# Patient Record
Sex: Female | Born: 1960
Health system: Southern US, Community
[De-identification: ages and names within clinical notes are randomized; demographics above are authoritative.]

## PROBLEM LIST (undated history)

## (undated) DIAGNOSIS — N200 Calculus of kidney: Secondary | ICD-10-CM

## (undated) DIAGNOSIS — N189 Chronic kidney disease, unspecified: Secondary | ICD-10-CM

## (undated) DIAGNOSIS — J45909 Unspecified asthma, uncomplicated: Secondary | ICD-10-CM

## (undated) DIAGNOSIS — L309 Dermatitis, unspecified: Secondary | ICD-10-CM

## (undated) DIAGNOSIS — F32A Depression, unspecified: Secondary | ICD-10-CM

## (undated) DIAGNOSIS — K649 Unspecified hemorrhoids: Secondary | ICD-10-CM

## (undated) DIAGNOSIS — E559 Vitamin D deficiency, unspecified: Secondary | ICD-10-CM

## (undated) DIAGNOSIS — L719 Rosacea, unspecified: Secondary | ICD-10-CM

## (undated) DIAGNOSIS — E039 Hypothyroidism, unspecified: Secondary | ICD-10-CM

## (undated) DIAGNOSIS — Z87442 Personal history of urinary calculi: Secondary | ICD-10-CM

## (undated) DIAGNOSIS — I219 Acute myocardial infarction, unspecified: Secondary | ICD-10-CM

## (undated) DIAGNOSIS — E11319 Type 2 diabetes mellitus with unspecified diabetic retinopathy without macular edema: Secondary | ICD-10-CM

## (undated) DIAGNOSIS — F40240 Claustrophobia: Secondary | ICD-10-CM

## (undated) DIAGNOSIS — Z794 Long term (current) use of insulin: Secondary | ICD-10-CM

## (undated) DIAGNOSIS — R809 Proteinuria, unspecified: Secondary | ICD-10-CM

## (undated) DIAGNOSIS — I10 Essential (primary) hypertension: Secondary | ICD-10-CM

## (undated) DIAGNOSIS — N939 Abnormal uterine and vaginal bleeding, unspecified: Secondary | ICD-10-CM

## (undated) DIAGNOSIS — R519 Headache, unspecified: Secondary | ICD-10-CM

## (undated) DIAGNOSIS — N3946 Mixed incontinence: Secondary | ICD-10-CM

## (undated) DIAGNOSIS — E119 Type 2 diabetes mellitus without complications: Secondary | ICD-10-CM

## (undated) DIAGNOSIS — E785 Hyperlipidemia, unspecified: Secondary | ICD-10-CM

## (undated) DIAGNOSIS — F329 Major depressive disorder, single episode, unspecified: Secondary | ICD-10-CM

## (undated) DIAGNOSIS — Z6841 Body Mass Index (BMI) 40.0 and over, adult: Secondary | ICD-10-CM

## (undated) DIAGNOSIS — M1990 Unspecified osteoarthritis, unspecified site: Secondary | ICD-10-CM

## (undated) DIAGNOSIS — D126 Benign neoplasm of colon, unspecified: Secondary | ICD-10-CM

## (undated) HISTORY — PX: COLONOSCOPY: SHX174

## (undated) HISTORY — PX: JOINT REPLACEMENT: SHX530

## (undated) HISTORY — PX: OTHER SURGICAL HISTORY: SHX169

## (undated) HISTORY — PX: LITHOTRIPSY: SUR834

## (undated) HISTORY — DX: Calculus of kidney: N20.0

---
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
## 2004-04-26 ENCOUNTER — Ambulatory Visit: Payer: Self-pay | Admitting: Family Medicine

## 2004-05-29 ENCOUNTER — Ambulatory Visit: Payer: Self-pay | Admitting: Family Medicine

## 2004-12-21 ENCOUNTER — Ambulatory Visit: Payer: Self-pay | Admitting: Internal Medicine

## 2005-05-13 ENCOUNTER — Ambulatory Visit: Payer: Self-pay

## 2006-06-02 ENCOUNTER — Ambulatory Visit: Payer: Self-pay

## 2007-06-04 ENCOUNTER — Ambulatory Visit: Payer: Self-pay

## 2007-12-20 ENCOUNTER — Emergency Department: Payer: Self-pay | Admitting: Emergency Medicine

## 2008-05-04 ENCOUNTER — Ambulatory Visit: Payer: Self-pay | Admitting: Urology

## 2008-05-04 ENCOUNTER — Emergency Department: Payer: Self-pay | Admitting: Emergency Medicine

## 2008-05-05 ENCOUNTER — Ambulatory Visit: Payer: Self-pay | Admitting: Urology

## 2008-05-20 ENCOUNTER — Ambulatory Visit: Payer: Self-pay | Admitting: Urology

## 2008-07-04 ENCOUNTER — Ambulatory Visit: Payer: Self-pay

## 2009-07-10 ENCOUNTER — Ambulatory Visit: Payer: Self-pay

## 2010-05-15 ENCOUNTER — Ambulatory Visit: Payer: Self-pay | Admitting: Urology

## 2010-06-05 ENCOUNTER — Ambulatory Visit: Payer: Self-pay | Admitting: Urology

## 2010-06-11 ENCOUNTER — Ambulatory Visit: Payer: Self-pay | Admitting: Urology

## 2010-10-29 ENCOUNTER — Ambulatory Visit: Payer: Self-pay | Admitting: Orthopedic Surgery

## 2011-02-19 ENCOUNTER — Ambulatory Visit: Payer: Self-pay | Admitting: Obstetrics and Gynecology

## 2011-07-15 ENCOUNTER — Ambulatory Visit: Payer: Self-pay | Admitting: Internal Medicine

## 2012-02-25 ENCOUNTER — Ambulatory Visit: Payer: Self-pay | Admitting: Obstetrics and Gynecology

## 2012-07-03 ENCOUNTER — Ambulatory Visit: Payer: Self-pay | Admitting: Unknown Physician Specialty

## 2012-07-06 LAB — PATHOLOGY REPORT

## 2013-02-25 ENCOUNTER — Ambulatory Visit: Payer: Self-pay | Admitting: Obstetrics and Gynecology

## 2014-01-24 DIAGNOSIS — E559 Vitamin D deficiency, unspecified: Secondary | ICD-10-CM | POA: Insufficient documentation

## 2014-03-09 ENCOUNTER — Ambulatory Visit: Payer: Self-pay | Admitting: Obstetrics and Gynecology

## 2014-03-14 ENCOUNTER — Ambulatory Visit: Payer: Self-pay | Admitting: Obstetrics and Gynecology

## 2014-09-13 ENCOUNTER — Ambulatory Visit: Payer: Self-pay | Admitting: Obstetrics and Gynecology

## 2015-03-06 ENCOUNTER — Other Ambulatory Visit: Payer: Self-pay | Admitting: Obstetrics and Gynecology

## 2015-03-06 DIAGNOSIS — Z1231 Encounter for screening mammogram for malignant neoplasm of breast: Secondary | ICD-10-CM

## 2015-03-06 DIAGNOSIS — R921 Mammographic calcification found on diagnostic imaging of breast: Secondary | ICD-10-CM

## 2015-03-16 ENCOUNTER — Ambulatory Visit
Admission: RE | Admit: 2015-03-16 | Discharge: 2015-03-16 | Disposition: A | Discharge status: Home / Self Care | Payer: 59 | Source: Ambulatory Visit | Attending: Obstetrics and Gynecology | Admitting: Obstetrics and Gynecology

## 2015-03-16 ENCOUNTER — Other Ambulatory Visit: Payer: Self-pay | Admitting: Obstetrics and Gynecology

## 2015-03-16 DIAGNOSIS — R921 Mammographic calcification found on diagnostic imaging of breast: Secondary | ICD-10-CM | POA: Diagnosis not present

## 2015-03-16 DIAGNOSIS — Z1231 Encounter for screening mammogram for malignant neoplasm of breast: Secondary | ICD-10-CM

## 2015-06-27 ENCOUNTER — Other Ambulatory Visit: Payer: Self-pay | Admitting: Urology

## 2015-06-27 DIAGNOSIS — R319 Hematuria, unspecified: Secondary | ICD-10-CM

## 2015-06-27 DIAGNOSIS — N200 Calculus of kidney: Secondary | ICD-10-CM

## 2015-07-05 ENCOUNTER — Ambulatory Visit: Payer: 59

## 2016-03-18 DIAGNOSIS — M1712 Unilateral primary osteoarthritis, left knee: Secondary | ICD-10-CM | POA: Insufficient documentation

## 2016-03-27 ENCOUNTER — Other Ambulatory Visit: Payer: Self-pay | Admitting: Obstetrics and Gynecology

## 2016-03-27 DIAGNOSIS — R921 Mammographic calcification found on diagnostic imaging of breast: Secondary | ICD-10-CM

## 2016-03-27 DIAGNOSIS — Z1231 Encounter for screening mammogram for malignant neoplasm of breast: Secondary | ICD-10-CM

## 2016-04-12 ENCOUNTER — Other Ambulatory Visit: Payer: Self-pay | Admitting: Obstetrics and Gynecology

## 2016-04-12 ENCOUNTER — Ambulatory Visit
Admission: RE | Admit: 2016-04-12 | Discharge: 2016-04-12 | Disposition: A | Discharge status: Home / Self Care | Payer: BLUE CROSS/BLUE SHIELD | Source: Ambulatory Visit | Attending: Obstetrics and Gynecology | Admitting: Obstetrics and Gynecology

## 2016-04-12 DIAGNOSIS — R921 Mammographic calcification found on diagnostic imaging of breast: Secondary | ICD-10-CM | POA: Diagnosis present

## 2016-04-12 DIAGNOSIS — Z1231 Encounter for screening mammogram for malignant neoplasm of breast: Secondary | ICD-10-CM

## 2016-09-12 DIAGNOSIS — M1712 Unilateral primary osteoarthritis, left knee: Secondary | ICD-10-CM | POA: Diagnosis not present

## 2016-09-12 DIAGNOSIS — Z6841 Body Mass Index (BMI) 40.0 and over, adult: Secondary | ICD-10-CM | POA: Diagnosis not present

## 2016-11-28 DIAGNOSIS — E785 Hyperlipidemia, unspecified: Secondary | ICD-10-CM | POA: Diagnosis not present

## 2016-11-28 DIAGNOSIS — E1165 Type 2 diabetes mellitus with hyperglycemia: Secondary | ICD-10-CM | POA: Diagnosis not present

## 2016-11-28 DIAGNOSIS — E559 Vitamin D deficiency, unspecified: Secondary | ICD-10-CM | POA: Diagnosis not present

## 2016-11-28 DIAGNOSIS — E538 Deficiency of other specified B group vitamins: Secondary | ICD-10-CM | POA: Diagnosis not present

## 2016-11-28 DIAGNOSIS — Z794 Long term (current) use of insulin: Secondary | ICD-10-CM | POA: Diagnosis not present

## 2016-12-05 DIAGNOSIS — E1165 Type 2 diabetes mellitus with hyperglycemia: Secondary | ICD-10-CM | POA: Diagnosis not present

## 2016-12-05 DIAGNOSIS — E039 Hypothyroidism, unspecified: Secondary | ICD-10-CM | POA: Diagnosis not present

## 2016-12-05 DIAGNOSIS — E559 Vitamin D deficiency, unspecified: Secondary | ICD-10-CM | POA: Diagnosis not present

## 2016-12-05 DIAGNOSIS — E1129 Type 2 diabetes mellitus with other diabetic kidney complication: Secondary | ICD-10-CM | POA: Diagnosis not present

## 2016-12-10 DIAGNOSIS — M1712 Unilateral primary osteoarthritis, left knee: Secondary | ICD-10-CM | POA: Diagnosis not present

## 2016-12-17 DIAGNOSIS — L718 Other rosacea: Secondary | ICD-10-CM | POA: Diagnosis not present

## 2016-12-17 DIAGNOSIS — Z86018 Personal history of other benign neoplasm: Secondary | ICD-10-CM | POA: Diagnosis not present

## 2016-12-17 DIAGNOSIS — I8311 Varicose veins of right lower extremity with inflammation: Secondary | ICD-10-CM | POA: Diagnosis not present

## 2016-12-17 DIAGNOSIS — L218 Other seborrheic dermatitis: Secondary | ICD-10-CM | POA: Diagnosis not present

## 2017-01-30 DIAGNOSIS — Z794 Long term (current) use of insulin: Secondary | ICD-10-CM | POA: Diagnosis not present

## 2017-01-30 DIAGNOSIS — E1129 Type 2 diabetes mellitus with other diabetic kidney complication: Secondary | ICD-10-CM | POA: Diagnosis not present

## 2017-01-30 DIAGNOSIS — E1165 Type 2 diabetes mellitus with hyperglycemia: Secondary | ICD-10-CM | POA: Diagnosis not present

## 2017-02-06 DIAGNOSIS — Z794 Long term (current) use of insulin: Secondary | ICD-10-CM | POA: Diagnosis not present

## 2017-02-06 DIAGNOSIS — Z6841 Body Mass Index (BMI) 40.0 and over, adult: Secondary | ICD-10-CM | POA: Diagnosis not present

## 2017-02-06 DIAGNOSIS — E1165 Type 2 diabetes mellitus with hyperglycemia: Secondary | ICD-10-CM | POA: Diagnosis not present

## 2017-02-20 DIAGNOSIS — E1129 Type 2 diabetes mellitus with other diabetic kidney complication: Secondary | ICD-10-CM | POA: Diagnosis not present

## 2017-02-20 DIAGNOSIS — R809 Proteinuria, unspecified: Secondary | ICD-10-CM | POA: Diagnosis not present

## 2017-02-20 DIAGNOSIS — Z6841 Body Mass Index (BMI) 40.0 and over, adult: Secondary | ICD-10-CM | POA: Diagnosis not present

## 2017-03-11 DIAGNOSIS — E1165 Type 2 diabetes mellitus with hyperglycemia: Secondary | ICD-10-CM | POA: Diagnosis not present

## 2017-03-11 DIAGNOSIS — Z794 Long term (current) use of insulin: Secondary | ICD-10-CM | POA: Diagnosis not present

## 2017-03-11 DIAGNOSIS — E1129 Type 2 diabetes mellitus with other diabetic kidney complication: Secondary | ICD-10-CM | POA: Diagnosis not present

## 2017-03-11 DIAGNOSIS — E039 Hypothyroidism, unspecified: Secondary | ICD-10-CM | POA: Diagnosis not present

## 2017-03-13 DIAGNOSIS — Z6841 Body Mass Index (BMI) 40.0 and over, adult: Secondary | ICD-10-CM | POA: Diagnosis not present

## 2017-03-13 DIAGNOSIS — M1712 Unilateral primary osteoarthritis, left knee: Secondary | ICD-10-CM | POA: Diagnosis not present

## 2017-03-19 DIAGNOSIS — L218 Other seborrheic dermatitis: Secondary | ICD-10-CM | POA: Diagnosis not present

## 2017-03-19 DIAGNOSIS — L718 Other rosacea: Secondary | ICD-10-CM | POA: Diagnosis not present

## 2017-03-27 ENCOUNTER — Other Ambulatory Visit: Payer: Self-pay | Admitting: Obstetrics and Gynecology

## 2017-03-27 DIAGNOSIS — Z1231 Encounter for screening mammogram for malignant neoplasm of breast: Secondary | ICD-10-CM

## 2017-03-27 DIAGNOSIS — Z01419 Encounter for gynecological examination (general) (routine) without abnormal findings: Secondary | ICD-10-CM | POA: Diagnosis not present

## 2017-03-27 DIAGNOSIS — Z1211 Encounter for screening for malignant neoplasm of colon: Secondary | ICD-10-CM | POA: Diagnosis not present

## 2017-04-15 ENCOUNTER — Ambulatory Visit
Admission: RE | Admit: 2017-04-15 | Discharge: 2017-04-15 | Disposition: A | Discharge status: Home / Self Care | Payer: BLUE CROSS/BLUE SHIELD | Source: Ambulatory Visit | Attending: Obstetrics and Gynecology | Admitting: Obstetrics and Gynecology

## 2017-04-15 DIAGNOSIS — Z1231 Encounter for screening mammogram for malignant neoplasm of breast: Secondary | ICD-10-CM | POA: Diagnosis not present

## 2017-05-20 DIAGNOSIS — E119 Type 2 diabetes mellitus without complications: Secondary | ICD-10-CM | POA: Diagnosis not present

## 2017-06-12 DIAGNOSIS — R809 Proteinuria, unspecified: Secondary | ICD-10-CM | POA: Diagnosis not present

## 2017-06-12 DIAGNOSIS — E559 Vitamin D deficiency, unspecified: Secondary | ICD-10-CM | POA: Diagnosis not present

## 2017-06-12 DIAGNOSIS — E1129 Type 2 diabetes mellitus with other diabetic kidney complication: Secondary | ICD-10-CM | POA: Diagnosis not present

## 2017-06-12 DIAGNOSIS — Z794 Long term (current) use of insulin: Secondary | ICD-10-CM | POA: Diagnosis not present

## 2017-06-12 DIAGNOSIS — E039 Hypothyroidism, unspecified: Secondary | ICD-10-CM | POA: Diagnosis not present

## 2017-07-10 DIAGNOSIS — E559 Vitamin D deficiency, unspecified: Secondary | ICD-10-CM | POA: Diagnosis not present

## 2017-07-10 DIAGNOSIS — E039 Hypothyroidism, unspecified: Secondary | ICD-10-CM | POA: Diagnosis not present

## 2017-07-10 DIAGNOSIS — E1129 Type 2 diabetes mellitus with other diabetic kidney complication: Secondary | ICD-10-CM | POA: Diagnosis not present

## 2017-07-18 DIAGNOSIS — R531 Weakness: Secondary | ICD-10-CM | POA: Diagnosis not present

## 2017-07-18 DIAGNOSIS — E039 Hypothyroidism, unspecified: Secondary | ICD-10-CM | POA: Diagnosis not present

## 2017-07-18 DIAGNOSIS — E1129 Type 2 diabetes mellitus with other diabetic kidney complication: Secondary | ICD-10-CM | POA: Diagnosis not present

## 2017-07-18 DIAGNOSIS — Z794 Long term (current) use of insulin: Secondary | ICD-10-CM | POA: Diagnosis not present

## 2017-07-18 DIAGNOSIS — E559 Vitamin D deficiency, unspecified: Secondary | ICD-10-CM | POA: Diagnosis not present

## 2017-07-18 DIAGNOSIS — R809 Proteinuria, unspecified: Secondary | ICD-10-CM | POA: Diagnosis not present

## 2017-07-31 DIAGNOSIS — E1129 Type 2 diabetes mellitus with other diabetic kidney complication: Secondary | ICD-10-CM | POA: Diagnosis not present

## 2017-07-31 DIAGNOSIS — E1165 Type 2 diabetes mellitus with hyperglycemia: Secondary | ICD-10-CM | POA: Diagnosis not present

## 2017-07-31 DIAGNOSIS — R809 Proteinuria, unspecified: Secondary | ICD-10-CM | POA: Diagnosis not present

## 2017-08-26 DIAGNOSIS — M1712 Unilateral primary osteoarthritis, left knee: Secondary | ICD-10-CM | POA: Diagnosis not present

## 2017-09-12 DIAGNOSIS — Z794 Long term (current) use of insulin: Secondary | ICD-10-CM | POA: Diagnosis not present

## 2017-09-12 DIAGNOSIS — E119 Type 2 diabetes mellitus without complications: Secondary | ICD-10-CM | POA: Diagnosis not present

## 2017-09-12 DIAGNOSIS — Z86010 Personal history of colonic polyps: Secondary | ICD-10-CM | POA: Diagnosis not present

## 2017-10-03 DIAGNOSIS — E1129 Type 2 diabetes mellitus with other diabetic kidney complication: Secondary | ICD-10-CM | POA: Diagnosis not present

## 2017-10-03 DIAGNOSIS — E039 Hypothyroidism, unspecified: Secondary | ICD-10-CM | POA: Diagnosis not present

## 2017-10-03 DIAGNOSIS — E785 Hyperlipidemia, unspecified: Secondary | ICD-10-CM | POA: Diagnosis not present

## 2017-10-03 DIAGNOSIS — E538 Deficiency of other specified B group vitamins: Secondary | ICD-10-CM | POA: Diagnosis not present

## 2017-11-10 DIAGNOSIS — E119 Type 2 diabetes mellitus without complications: Secondary | ICD-10-CM | POA: Diagnosis not present

## 2017-11-10 DIAGNOSIS — E559 Vitamin D deficiency, unspecified: Secondary | ICD-10-CM | POA: Diagnosis not present

## 2017-11-13 DIAGNOSIS — E119 Type 2 diabetes mellitus without complications: Secondary | ICD-10-CM | POA: Diagnosis not present

## 2017-11-25 DIAGNOSIS — M1712 Unilateral primary osteoarthritis, left knee: Secondary | ICD-10-CM | POA: Diagnosis not present

## 2017-11-28 ENCOUNTER — Encounter: Payer: Self-pay | Admitting: Student

## 2017-12-01 ENCOUNTER — Ambulatory Visit
Admission: RE | Admit: 2017-12-01 | Discharge: 2017-12-01 | Disposition: A | Discharge status: Home / Self Care | Payer: BLUE CROSS/BLUE SHIELD | Source: Ambulatory Visit | Attending: Unknown Physician Specialty | Admitting: Unknown Physician Specialty

## 2017-12-01 ENCOUNTER — Encounter
Admission: RE | Disposition: A | Discharge status: Home / Self Care | Payer: Self-pay | Source: Ambulatory Visit | Attending: Unknown Physician Specialty

## 2017-12-01 ENCOUNTER — Ambulatory Visit: Payer: BLUE CROSS/BLUE SHIELD | Admitting: Anesthesiology

## 2017-12-01 DIAGNOSIS — E119 Type 2 diabetes mellitus without complications: Secondary | ICD-10-CM | POA: Insufficient documentation

## 2017-12-01 DIAGNOSIS — Z86010 Personal history of colonic polyps: Secondary | ICD-10-CM | POA: Diagnosis not present

## 2017-12-01 DIAGNOSIS — K640 First degree hemorrhoids: Secondary | ICD-10-CM | POA: Insufficient documentation

## 2017-12-01 DIAGNOSIS — J45909 Unspecified asthma, uncomplicated: Secondary | ICD-10-CM | POA: Diagnosis not present

## 2017-12-01 DIAGNOSIS — Z6841 Body Mass Index (BMI) 40.0 and over, adult: Secondary | ICD-10-CM | POA: Diagnosis not present

## 2017-12-01 DIAGNOSIS — Z888 Allergy status to other drugs, medicaments and biological substances status: Secondary | ICD-10-CM | POA: Diagnosis not present

## 2017-12-01 DIAGNOSIS — E785 Hyperlipidemia, unspecified: Secondary | ICD-10-CM | POA: Diagnosis not present

## 2017-12-01 DIAGNOSIS — F329 Major depressive disorder, single episode, unspecified: Secondary | ICD-10-CM | POA: Diagnosis not present

## 2017-12-01 DIAGNOSIS — Z1211 Encounter for screening for malignant neoplasm of colon: Secondary | ICD-10-CM | POA: Diagnosis not present

## 2017-12-01 DIAGNOSIS — Z885 Allergy status to narcotic agent status: Secondary | ICD-10-CM | POA: Insufficient documentation

## 2017-12-01 DIAGNOSIS — Z794 Long term (current) use of insulin: Secondary | ICD-10-CM | POA: Insufficient documentation

## 2017-12-01 DIAGNOSIS — Z7982 Long term (current) use of aspirin: Secondary | ICD-10-CM | POA: Insufficient documentation

## 2017-12-01 DIAGNOSIS — Z79899 Other long term (current) drug therapy: Secondary | ICD-10-CM | POA: Insufficient documentation

## 2017-12-01 DIAGNOSIS — K648 Other hemorrhoids: Secondary | ICD-10-CM | POA: Diagnosis not present

## 2017-12-01 HISTORY — DX: Depression, unspecified: F32.A

## 2017-12-01 HISTORY — DX: Type 2 diabetes mellitus without complications: E11.9

## 2017-12-01 HISTORY — DX: Dermatitis, unspecified: L30.9

## 2017-12-01 HISTORY — DX: Hyperlipidemia, unspecified: E78.5

## 2017-12-01 HISTORY — DX: Unspecified asthma, uncomplicated: J45.909

## 2017-12-01 HISTORY — PX: COLONOSCOPY WITH PROPOFOL: SHX5780

## 2017-12-01 HISTORY — DX: Body Mass Index (BMI) 40.0 and over, adult: Z684

## 2017-12-01 HISTORY — DX: Abnormal uterine and vaginal bleeding, unspecified: N93.9

## 2017-12-01 HISTORY — DX: Morbid (severe) obesity due to excess calories: E66.01

## 2017-12-01 HISTORY — DX: Personal history of urinary calculi: Z87.442

## 2017-12-01 HISTORY — DX: Calculus of kidney: N20.0

## 2017-12-01 HISTORY — DX: Mixed incontinence: N39.46

## 2017-12-01 HISTORY — DX: Major depressive disorder, single episode, unspecified: F32.9

## 2017-12-01 LAB — GLUCOSE, CAPILLARY: GLUCOSE-CAPILLARY: 207 mg/dL — AB (ref 65–99)

## 2017-12-01 SURGERY — COLONOSCOPY WITH PROPOFOL
Anesthesia: General

## 2017-12-01 MED ORDER — LIDOCAINE HCL (PF) 2 % IJ SOLN
INTRAMUSCULAR | Status: AC
  Filled 2017-12-01: qty 10

## 2017-12-01 MED ORDER — LIDOCAINE HCL (CARDIAC) PF 100 MG/5ML IV SOSY
PREFILLED_SYRINGE | INTRAVENOUS | Status: DC | PRN
  Administered 2017-12-01: 50 mg via INTRAVENOUS

## 2017-12-01 MED ORDER — PROPOFOL 500 MG/50ML IV EMUL
INTRAVENOUS | Status: DC | PRN
  Administered 2017-12-01: 175 ug/kg/min via INTRAVENOUS

## 2017-12-01 MED ORDER — SODIUM CHLORIDE 0.9 % IV SOLN
INTRAVENOUS | Status: DC
  Administered 2017-12-01: 07:00:00 via INTRAVENOUS

## 2017-12-01 MED ORDER — SODIUM CHLORIDE 0.9 % IV SOLN
INTRAVENOUS | Status: DC
  Administered 2017-12-01: 08:00:00 via INTRAVENOUS

## 2017-12-01 MED ORDER — PROPOFOL 10 MG/ML IV BOLUS
INTRAVENOUS | Status: DC | PRN
  Administered 2017-12-01: 30 mg via INTRAVENOUS

## 2017-12-01 NOTE — Anesthesia Postprocedure Evaluation (Signed)
Anesthesia Post Note  Patient: Stephanie Mathews  Procedure(s) Performed: COLONOSCOPY WITH PROPOFOL (N/A )  Patient location during evaluation: Endoscopy Anesthesia Type: General Level of consciousness: awake and alert and oriented Pain management: pain level controlled Vital Signs Assessment: post-procedure vital signs reviewed and stable Respiratory status: spontaneous breathing, nonlabored ventilation and respiratory function stable Cardiovascular status: blood pressure returned to baseline and stable Postop Assessment: no signs of nausea or vomiting Anesthetic complications: no     Last Vitals:  Vitals:   12/01/17 0826 12/01/17 0836  BP: 120/87 125/80  Pulse: 95 95  Resp: 17 16  Temp:    SpO2: 99% 100%    Last Pain:  Vitals:   12/01/17 0836  TempSrc:   PainSc: 0-No pain                 [name]

## 2017-12-01 NOTE — Anesthesia Procedure Notes (Signed)
Date/Time: 12/01/2017 7:36 AM Performed by: Johnna Acosta, CRNA Pre-anesthesia Checklist: Patient identified, Emergency Drugs available, Suction available, Patient being monitored and Timeout performed Patient Re-evaluated:Patient Re-evaluated prior to induction Oxygen Delivery Method: Nasal cannula Preoxygenation: Pre-oxygenation with 100% oxygen

## 2017-12-01 NOTE — Transfer of Care (Signed)
Immediate Anesthesia Transfer of Care Note  Patient: Stephanie Mathews  Procedure(s) Performed: COLONOSCOPY WITH PROPOFOL (N/A )  Patient Location: PACU  Anesthesia Type:General  Level of Consciousness: sedated  Airway & Oxygen Therapy: Patient Spontanous Breathing and Patient connected to nasal cannula oxygen  Post-op Assessment: Report given to RN and Post -op Vital signs reviewed and stable  Post vital signs: Reviewed and stable  Last Vitals:  Vitals Value Taken Time  BP 95/45 12/01/2017  8:06 AM  Temp 36.1 C 12/01/2017  8:06 AM  Pulse 100 12/01/2017  8:07 AM  Resp 13 12/01/2017  8:07 AM  SpO2 97 % 12/01/2017  8:07 AM  Vitals shown include unvalidated device data.  Last Pain:  Vitals:   12/01/17 0806  TempSrc: Tympanic  PainSc: 0-No pain         Complications: No apparent anesthesia complications

## 2017-12-01 NOTE — Op Note (Signed)
[facility] Gastroenterology Patient Name: Stephanie Mathews Procedure Date: 12/01/2017 7:28 AM MRN: [id number] Account #: [id number] Date of Birth: 1961/06/27 Admit Type: Outpatient Age: 57 Room: [facility] ENDO ROOM 3 Gender: Female Note Status: Finalized Procedure:            Colonoscopy Indications:          High risk colon cancer surveillance: Personal history                        of colonic polyps Providers:            Manya Silvas, MD Referring MD:         Juluis Rainier (Referring MD) Medicines:            Propofol per Anesthesia Complications:        No immediate complications. Procedure:            Pre-Anesthesia Assessment:                       - After reviewing the risks and benefits, the patient                        was deemed in satisfactory condition to undergo the                        procedure.                       After obtaining informed consent, the colonoscope was                        passed under direct vision. Throughout the procedure,                        the patient's blood pressure, pulse, and oxygen                        saturations were monitored continuously. The                        Colonoscope was introduced through the anus and                        advanced to the the cecum, identified by appendiceal                        orifice and ileocecal valve. The colonoscopy was                        performed without difficulty. The patient tolerated the                        procedure well. The quality of the bowel preparation                        was excellent. Findings:      Internal hemorrhoids were found during endoscopy. The hemorrhoids were       small and Grade I (internal hemorrhoids that do not prolapse).      The exam was otherwise without abnormality. Impression:           - Internal  hemorrhoids.                       - The examination was otherwise normal.                       - No specimens  collected. Recommendation:       - Repeat colonoscopy in 5 years for screening purposes. Manya Silvas, MD 12/01/2017 8:06:11 AM This report has been signed electronically. Number of Addenda: 0 Note Initiated On: 12/01/2017 7:28 AM Scope Withdrawal Time: 0 hours 15 minutes 57 seconds  Total Procedure Duration: 0 hours 23 minutes 28 seconds       [facility]

## 2017-12-01 NOTE — H&P (Signed)
Primary Care Physician:  Sallee Lange, NP Primary Gastroenterologist:  Dr. Vira Agar  Pre-Procedure History & Physical: HPI:  Stephanie Mathews is a 57 y.o. female is here for an colonoscopy.Done for [facility] colon polyps.  Past Medical History:  Diagnosis Date  . Abnormal uterine bleeding   . Asthma   . Depression   . Diabetes mellitus without complication ([place])   . Eczema   . History of kidney stones   . Hyperlipidemia   . Kidney stones   . Morbid obesity with BMI of 50.0-59.9, adult ([place])   . Urinary incontinence, mixed     Past Surgical History:  Procedure Laterality Date  . CESAREAN SECTION    . COLONOSCOPY    . Excision left foot    . LITHOTRIPSY      Prior to Admission medications   Medication Sig Start Date End Date Taking? Authorizing Provider  aspirin 81 MG chewable tablet Chew 81 mg by mouth daily.   Yes [provider]  calcium citrate-vitamin D (CITRACAL+D) 315-200 MG-UNIT tablet Take 1 tablet by mouth 2 (two) times daily.   Yes [provider]  Chromium 200 MCG TABS Take by mouth.   Yes [provider]  citric acid-potassium citrate (POLYCITRA) 1100-334 MG/5ML solution Take 10 mEq by mouth 3 (three) times daily.   Yes [provider]  crotamiton (CROTAN) 10 % cream Apply topically daily.   Yes [provider]  Cyanocobalamin 2500 MCG SUBL Place under the tongue.   Yes [provider]  dapagliflozin propanediol (FARXIGA) 10 MG TABS tablet Take 10 mg by mouth daily.   Yes [provider]  ergocalciferol (VITAMIN D2) 50000 units capsule Take 50,000 Units by mouth once a week.   Yes [provider]  fluconazole (DIFLUCAN) 200 MG tablet Take 200 mg by mouth daily.   Yes [provider]  Glucosamine-Chondroitin (OSTEO BI-FLEX REGULAR STRENGTH PO) Take by mouth.   Yes [provider]  insulin aspart (NOVOLOG FLEXPEN) 100 UNIT/ML FlexPen Inject into the skin 3 (three)  times daily with meals.   Yes [provider]  ivermectin (STROMECTOL) 3 MG TABS tablet Take by mouth once.   Yes [provider]  levothyroxine (SYNTHROID, LEVOTHROID) 25 MCG tablet Take 25 mcg by mouth daily before breakfast.   Yes [provider]  liraglutide (VICTOZA) 18 MG/3ML SOPN Inject into the skin.   Yes [provider]  loratadine (CLARITIN) 10 MG tablet Take 10 mg by mouth daily.   Yes [provider]  losartan (COZAAR) 25 MG tablet Take 25 mg by mouth daily.   Yes [provider]  metFORMIN (GLUCOPHAGE) 500 MG tablet Take 1,000 mg by mouth 2 (two) times daily with a meal.   Yes [provider]  potassium citrate (UROCIT-K) 10 MEQ (1080 MG) SR tablet Take 10 mEq by mouth 3 (three) times daily with meals.   Yes [provider]  ST JOHNS WORT PO Take by mouth.   Yes [provider]    Allergies as of 09/25/2017  . (Not on File)    History reviewed. No pertinent family history.  Social History   Socioeconomic History  . Marital status: Married    Spouse name: Not on file  . Number of children: Not on file  . Years of education: Not on file  . Highest education level: Not on file  Occupational History  . Not on file  Social Needs  . Financial resource strain: Not  on file  . Food insecurity:    Worry: Not on file    Inability: Not on file  . Transportation needs:    Medical: Not on file    Non-medical: Not on file  Tobacco Use  . Smoking status: Never Smoker  . Smokeless tobacco: Never Used  Substance and Sexual Activity  . Alcohol use: Yes    Frequency: Never    Comment: rare  . Drug use: Never  . Sexual activity: Not on file  Lifestyle  . Physical activity:    Days per week: Not on file    Minutes per session: Not on file  . Stress: Not on file  Relationships  . Social connections:    Talks on phone: Not on file    Gets together: Not on file    Attends religious service: Not  on file    Active member of club or organization: Not on file    Attends meetings of clubs or organizations: Not on file    Relationship status: Not on file  . Intimate partner violence:    Fear of current or ex partner: Not on file    Emotionally abused: Not on file    Physically abused: Not on file    Forced sexual activity: Not on file  Other Topics Concern  . Not on file  Social History Narrative  . Not on file    Review of Systems: See HPI, otherwise negative ROS  Physical Exam: There were no vitals taken for this visit. General:   Alert,  pleasant and cooperative in NAD Head:  Normocephalic and atraumatic. Neck:  Supple; no masses or thyromegaly. Lungs:  Clear throughout to auscultation.    Heart:  Regular rate and rhythm. Abdomen:  Soft, nontender and nondistended. Normal bowel sounds, without guarding, and without rebound.   Neurologic:  Alert and  oriented x4;  grossly normal neurologically.  Impression/Plan: Stephanie Mathews is here for an colonoscopy to be performed for [facility] colon polyps.  Risks, benefits, limitations, and alternatives regarding  colonoscopy have been reviewed with the patient.  Questions have been answered.  All parties agreeable.   Gaylyn Cheers, MD  12/01/2017, 7:31 AM

## 2017-12-01 NOTE — Anesthesia Preprocedure Evaluation (Signed)
Anesthesia Evaluation  Patient identified by MRN, date of birth, ID band Patient awake    Reviewed: Allergy & Precautions, NPO status , Patient's Chart, lab work & pertinent test results  History of Anesthesia Complications Negative for: history of anesthetic complications  Airway Mallampati: III  TM Distance: >3 FB Neck ROM: Full    Dental no notable dental hx.    Pulmonary asthma (humidity induced) , neg sleep apnea, neg COPD,    breath sounds clear to auscultation- rhonchi (-) wheezing      Cardiovascular (-) hypertension(-) CAD, (-) Past MI, (-) Cardiac Stents and (-) CABG  Rhythm:Regular Rate:Normal - Systolic murmurs and - Diastolic murmurs    Neuro/Psych PSYCHIATRIC DISORDERS Depression negative neurological ROS     GI/Hepatic negative GI ROS, Neg liver ROS,   Endo/Other  diabetes, Insulin DependentMorbid obesity  Renal/GU Renal disease: hx of nephrolithiasis.     Musculoskeletal negative musculoskeletal ROS (+)   Abdominal (+) + obese,   Peds  Hematology negative hematology ROS (+)   Anesthesia Other Findings Past Medical History: No date: Abnormal uterine bleeding No date: Asthma No date: Depression No date: Diabetes mellitus without complication (HCC) No date: Eczema No date: History of kidney stones No date: Hyperlipidemia No date: Kidney stones No date: Morbid obesity with BMI of 50.0-59.9, adult (HCC) No date: Urinary incontinence, mixed   Reproductive/Obstetrics                             Anesthesia Physical Anesthesia Plan  ASA: II  Anesthesia Plan: General   Post-op Pain Management:    Induction: Intravenous  PONV Risk Score and Plan: 2 and Propofol infusion  Airway Management Planned: Natural Airway  Additional Equipment:   Intra-op Plan:   Post-operative Plan:   Informed Consent: I have reviewed the patients History and Physical, chart, labs and  discussed the procedure including the risks, benefits and alternatives for the proposed anesthesia with the patient or authorized representative who has indicated his/her understanding and acceptance.   Dental advisory given  Plan Discussed with: CRNA and Anesthesiologist  Anesthesia Plan Comments:         Anesthesia Quick Evaluation

## 2017-12-01 NOTE — Anesthesia Post-op Follow-up Note (Signed)
Anesthesia QCDR form completed.        

## 2017-12-02 ENCOUNTER — Encounter: Payer: Self-pay | Admitting: Unknown Physician Specialty

## 2017-12-15 DIAGNOSIS — Z86018 Personal history of other benign neoplasm: Secondary | ICD-10-CM | POA: Diagnosis not present

## 2017-12-15 DIAGNOSIS — L578 Other skin changes due to chronic exposure to nonionizing radiation: Secondary | ICD-10-CM | POA: Diagnosis not present

## 2017-12-15 DIAGNOSIS — L718 Other rosacea: Secondary | ICD-10-CM | POA: Diagnosis not present

## 2017-12-31 ENCOUNTER — Other Ambulatory Visit: Payer: Self-pay

## 2017-12-31 ENCOUNTER — Encounter
Admission: RE | Admit: 2017-12-31 | Discharge: 2017-12-31 | Disposition: A | Discharge status: Home / Self Care | Payer: BLUE CROSS/BLUE SHIELD | Source: Ambulatory Visit | Attending: Orthopedic Surgery | Admitting: Orthopedic Surgery

## 2017-12-31 DIAGNOSIS — R9431 Abnormal electrocardiogram [ECG] [EKG]: Secondary | ICD-10-CM | POA: Insufficient documentation

## 2017-12-31 DIAGNOSIS — Z01812 Encounter for preprocedural laboratory examination: Secondary | ICD-10-CM | POA: Diagnosis not present

## 2017-12-31 DIAGNOSIS — J45909 Unspecified asthma, uncomplicated: Secondary | ICD-10-CM | POA: Insufficient documentation

## 2017-12-31 DIAGNOSIS — Z01810 Encounter for preprocedural cardiovascular examination: Secondary | ICD-10-CM | POA: Insufficient documentation

## 2017-12-31 DIAGNOSIS — E119 Type 2 diabetes mellitus without complications: Secondary | ICD-10-CM | POA: Insufficient documentation

## 2017-12-31 HISTORY — DX: Unspecified osteoarthritis, unspecified site: M19.90

## 2017-12-31 HISTORY — DX: Hypothyroidism, unspecified: E03.9

## 2017-12-31 HISTORY — DX: Rosacea, unspecified: L71.9

## 2017-12-31 LAB — COMPREHENSIVE METABOLIC PANEL
ALK PHOS: 90 U/L (ref 38–126)
ALT: 20 U/L (ref 14–54)
ANION GAP: 12 (ref 5–15)
AST: 20 U/L (ref 15–41)
Albumin: 3.9 g/dL (ref 3.5–5.0)
BILIRUBIN TOTAL: 0.8 mg/dL (ref 0.3–1.2)
BUN: 31 mg/dL — ABNORMAL HIGH (ref 6–20)
CALCIUM: 9.6 mg/dL (ref 8.9–10.3)
CO2: 24 mmol/L (ref 22–32)
CREATININE: 1.09 mg/dL — AB (ref 0.44–1.00)
Chloride: 104 mmol/L (ref 101–111)
GFR calc non Af Amer: 56 mL/min — ABNORMAL LOW (ref >60)
GLUCOSE: 150 mg/dL — AB (ref 65–99)
Potassium: 4.2 mmol/L (ref 3.5–5.1)
Sodium: 140 mmol/L (ref 135–145)
TOTAL PROTEIN: 7.9 g/dL (ref 6.5–8.1)

## 2017-12-31 LAB — CBC
HEMATOCRIT: 48.3 % — AB (ref 35.0–47.0)
HEMOGLOBIN: 16.1 g/dL — AB (ref 12.0–16.0)
MCH: 31.5 pg (ref 26.0–34.0)
MCHC: 33.2 g/dL (ref 32.0–36.0)
MCV: 94.9 fL (ref 80.0–100.0)
Platelets: 244 10*3/uL (ref 150–440)
RBC: 5.09 MIL/uL (ref 3.80–5.20)
RDW: 13.8 % (ref 11.5–14.5)
WBC: 7 10*3/uL (ref 3.6–11.0)

## 2017-12-31 LAB — URINALYSIS, ROUTINE W REFLEX MICROSCOPIC
Bacteria, UA: NONE SEEN
Bilirubin Urine: NEGATIVE
Glucose, UA: >=500 mg/dL — AB
KETONES UR: 5 mg/dL — AB
Nitrite: NEGATIVE
PH: 7 (ref 5.0–8.0)
Protein, ur: NEGATIVE mg/dL
SPECIFIC GRAVITY, URINE: 1.018 (ref 1.005–1.030)

## 2017-12-31 LAB — APTT: aPTT: 28 seconds (ref 24–36)

## 2017-12-31 LAB — PROTIME-INR
INR: 0.85
Prothrombin Time: 11.5 seconds (ref 11.4–15.2)

## 2017-12-31 LAB — SEDIMENTATION RATE: Sed Rate: 26 mm/hr (ref 0–30)

## 2017-12-31 LAB — HEMOGLOBIN A1C
HEMOGLOBIN A1C: 6.9 % — AB (ref 4.8–5.6)
MEAN PLASMA GLUCOSE: 151.33 mg/dL

## 2017-12-31 LAB — TYPE AND SCREEN
ABO/RH(D): A POS
Antibody Screen: NEGATIVE

## 2017-12-31 LAB — SURGICAL PCR SCREEN
MRSA, PCR: NEGATIVE
Staphylococcus aureus: POSITIVE — AB

## 2017-12-31 LAB — C-REACTIVE PROTEIN: CRP: 3.9 mg/dL — ABNORMAL HIGH (ref <1.0)

## 2017-12-31 NOTE — Patient Instructions (Signed)
  Your procedure is scheduled on: Monday January 12, 2018 Report to Same Day Surgery 2nd floor medical mall [facility] Entrance-take elevator on left to 2nd floor.  Check in with surgery information desk.) To find out your arrival time please call [phone] between 1PM - 3PM on Friday January 09, 2018  Remember: Instructions that are not followed completely may result in serious medical risk, up to and including death, or upon the discretion of your surgeon and anesthesiologist your surgery may need to be rescheduled.    _x___ 1. Do not eat food after midnight the night before your procedure. You may drink water up to 2 hours before you are scheduled to arrive at the hospital for your procedure.  Do not drink anything within 2 hours of your scheduled arrival to the hospital.   No gum chewing or hard candies.      __x__ 2. No Alcohol for 24 hours before or after surgery.   __x__3. No Smoking or e-cigarettes for 24 prior to surgery.  Do not use any chewable tobacco products for at least 6 hour prior to surgery   ____  4. Bring all medications with you on the day of surgery if instructed.    __x__ 5. Notify your doctor if there is any change in your medical condition     (cold, fever, infections).   __x__6. On the morning of surgery brush your teeth with toothpaste and water.  You may rinse your mouth with mouth wash if you wish.  Do not swallow any toothpaste or mouthwash.   Do not wear jewelry, make-up, hairpins, clips or nail polish.  Do not wear lotions, powders, deodorant, or perfumes.   Do not shave 48 hours prior to surgery.   Do not bring valuables to the hospital.    [facility] is not responsible for any belongings or valuables.               Contacts, dentures or bridgework may not be worn into surgery.  Leave your suitcase in the car. After surgery it may be brought to your room.  For patients admitted to the hospital, discharge time is determined by your treatment  team.  Please read over the following fact sheets that you were given:   [facility] Preparing for Surgery and or MRSA Information   _x___ Take anti-hypertensive listed below, cardiac, seizure, asthma, anti-reflux and psychiatric medicines. These include:  1. Levothyroxine/Synthroid  2. Loratadine/Claritin  NO Losartan on day of surgery.  _x___ Use CHG Soap or sage wipes as directed on instruction sheet   _x___ Stop Metformin and Janumet 2 days prior to surgery (last dose January 09, 2018).    _x___ Take 1/2 of usual insulin dose the night before surgery and NONE on the morning of surgery.   _x___ Follow recommendations from Cardiologist, Pulmonologist or PCP regarding stopping Aspirin, Coumadin, Plavix ,Eliquis, Effient, or Pradaxa, and Pletal.  _x___Stop Anti-inflammatories such as Advil, Aleve, Ibuprofen, Motrin, Naproxen, Naprosyn, Goodies powders or aspirin products. OK to take Tylenol and Celebrex.   _x___ Stop supplements at least 7 days before surgery. You may continue Vitamin D, Vitamin B, and multivitamin.

## 2018-01-01 NOTE — Pre-Procedure Instructions (Signed)
EKG READ BY DR KARENZ AND OK 

## 2018-01-02 LAB — URINE CULTURE: SPECIAL REQUESTS: NORMAL

## 2018-01-11 ENCOUNTER — Encounter: Payer: Self-pay | Admitting: Orthopedic Surgery

## 2018-01-11 DIAGNOSIS — E119 Type 2 diabetes mellitus without complications: Secondary | ICD-10-CM | POA: Insufficient documentation

## 2018-01-11 DIAGNOSIS — E785 Hyperlipidemia, unspecified: Secondary | ICD-10-CM | POA: Insufficient documentation

## 2018-01-11 DIAGNOSIS — J45909 Unspecified asthma, uncomplicated: Secondary | ICD-10-CM | POA: Insufficient documentation

## 2018-01-11 DIAGNOSIS — L309 Dermatitis, unspecified: Secondary | ICD-10-CM | POA: Insufficient documentation

## 2018-01-11 DIAGNOSIS — Z6841 Body Mass Index (BMI) 40.0 and over, adult: Secondary | ICD-10-CM

## 2018-01-11 DIAGNOSIS — F32A Depression, unspecified: Secondary | ICD-10-CM | POA: Insufficient documentation

## 2018-01-11 DIAGNOSIS — F329 Major depressive disorder, single episode, unspecified: Secondary | ICD-10-CM | POA: Insufficient documentation

## 2018-01-11 DIAGNOSIS — N200 Calculus of kidney: Secondary | ICD-10-CM | POA: Insufficient documentation

## 2018-01-11 NOTE — Discharge Instructions (Signed)
°  Instructions after Total Knee Replacement ° ° Anthony Roland P. Jobe Mutch, Jr., M.D.    ° Dept. of Orthopaedics & Sports Medicine ° [facility] ° [address] ° [place], [place]  [address] ° Phone: [phone]   Fax: [phone] ° °  °DIET: °• Drink plenty of non-alcoholic fluids. °• Resume your normal diet. Include foods high in fiber. ° °ACTIVITY:  °• You may use crutches or a walker with weight-bearing as tolerated, unless instructed otherwise. °• You may be weaned off of the walker or crutches by your Physical Therapist.  °• Do NOT place pillows under the knee. Anything placed under the knee could limit your ability to straighten the knee.   °• Continue doing gentle exercises. Exercising will reduce the pain and swelling, increase motion, and prevent muscle weakness.   °• Please continue to use the TED compression stockings for 6 weeks. You may remove the stockings at night, but should reapply them in the morning. °• Do not drive or operate any equipment until instructed. ° °WOUND CARE:  °• Continue to use the PolarCare or ice packs periodically to reduce pain and swelling. °• You may bathe or shower after the staples are removed at the first office visit following surgery. ° °MEDICATIONS: °• You may resume your regular medications. °• Please take the pain medication as prescribed on the medication. °• Do not take pain medication on an empty stomach. °• You have been given a prescription for a blood thinner (Lovenox or Coumadin). Please take the medication as instructed. (NOTE: After completing a 2 week course of Lovenox, take one Enteric-coated aspirin once a day. This along with elevation will help reduce the possibility of phlebitis in your operated leg.) °• Do not drive or drink alcoholic beverages when taking pain medications. ° °CALL THE OFFICE FOR: °• Temperature above 101 degrees °• Excessive bleeding or drainage on the dressing. °• Excessive swelling, coldness, or paleness of the toes. °• Persistent  nausea and vomiting. ° °FOLLOW-UP:  °• You should have an appointment to return to the office in 10-14 days after surgery. °• Arrangements have been made for continuation of Physical Therapy (either home therapy or outpatient therapy). °  °

## 2018-01-12 ENCOUNTER — Inpatient Hospital Stay: Payer: BLUE CROSS/BLUE SHIELD

## 2018-01-12 ENCOUNTER — Ambulatory Visit
Admission: RE | Admit: 2018-01-12 | Discharge: 2018-01-12 | Disposition: A | Discharge status: Home / Self Care | Payer: BLUE CROSS/BLUE SHIELD | Source: Ambulatory Visit | Attending: Orthopedic Surgery | Admitting: Orthopedic Surgery

## 2018-01-12 ENCOUNTER — Encounter
Admission: RE | Disposition: A | Discharge status: Home / Self Care | Payer: Self-pay | Source: Ambulatory Visit | Attending: Orthopedic Surgery

## 2018-01-12 ENCOUNTER — Encounter: Payer: Self-pay | Admitting: *Deleted

## 2018-01-12 DIAGNOSIS — M1712 Unilateral primary osteoarthritis, left knee: Secondary | ICD-10-CM | POA: Insufficient documentation

## 2018-01-12 DIAGNOSIS — Z01812 Encounter for preprocedural laboratory examination: Secondary | ICD-10-CM | POA: Diagnosis not present

## 2018-01-12 LAB — GLUCOSE, CAPILLARY: Glucose-Capillary: 172 mg/dL — ABNORMAL HIGH (ref 70–99)

## 2018-01-12 LAB — ABO/RH: ABO/RH(D): A POS

## 2018-01-12 SURGERY — ARTHROPLASTY, KNEE, TOTAL, USING IMAGELESS COMPUTER-ASSISTED NAVIGATION
Anesthesia: Choice | Laterality: Left

## 2018-01-12 MED ORDER — BUPIVACAINE HCL (PF) 0.5 % IJ SOLN
INTRAMUSCULAR | Status: AC
  Filled 2018-01-12: qty 10

## 2018-01-12 MED ORDER — GABAPENTIN 300 MG PO CAPS
ORAL_CAPSULE | ORAL | Status: AC
  Filled 2018-01-12: qty 1

## 2018-01-12 MED ORDER — DEXAMETHASONE SODIUM PHOSPHATE 10 MG/ML IJ SOLN: 8.0000 mg | Freq: Once | INTRAMUSCULAR | Status: DC

## 2018-01-12 MED ORDER — CEFAZOLIN SODIUM-DEXTROSE 2-4 GM/100ML-% IV SOLN: 2.0000 g | INTRAVENOUS | Status: DC

## 2018-01-12 MED ORDER — PROPOFOL 500 MG/50ML IV EMUL
INTRAVENOUS | Status: AC
  Filled 2018-01-12: qty 50

## 2018-01-12 MED ORDER — FAMOTIDINE 20 MG PO TABS
ORAL_TABLET | ORAL | Status: AC
  Filled 2018-01-12: qty 1

## 2018-01-12 MED ORDER — CELECOXIB 200 MG PO CAPS: 400.0000 mg | ORAL_CAPSULE | Freq: Once | ORAL | Status: DC

## 2018-01-12 MED ORDER — TRANEXAMIC ACID 1000 MG/10ML IV SOLN
1000.0000 mg | INTRAVENOUS | Status: DC
  Filled 2018-01-12: qty 10

## 2018-01-12 MED ORDER — GABAPENTIN 300 MG PO CAPS
300.0000 mg | ORAL_CAPSULE | Freq: Once | ORAL | Status: DC
Start: 2018-01-12 — End: 2018-01-13

## 2018-01-12 MED ORDER — CELECOXIB 200 MG PO CAPS
ORAL_CAPSULE | ORAL | Status: AC
  Filled 2018-01-12: qty 2

## 2018-01-12 MED ORDER — CHLORHEXIDINE GLUCONATE 4 % EX LIQD
60.0000 mL | Freq: Once | CUTANEOUS | Status: DC
Start: 2018-01-12 — End: 2018-01-13

## 2018-01-12 MED ORDER — SODIUM CHLORIDE 0.9 % IV SOLN: INTRAVENOUS | Status: DC

## 2018-01-12 MED ORDER — MIDAZOLAM HCL 2 MG/2ML IJ SOLN
INTRAMUSCULAR | Status: AC
  Filled 2018-01-12: qty 2

## 2018-01-12 MED ORDER — CEFAZOLIN SODIUM-DEXTROSE 2-4 GM/100ML-% IV SOLN
INTRAVENOUS | Status: AC
  Filled 2018-01-12: qty 100

## 2018-01-12 MED ORDER — DEXAMETHASONE SODIUM PHOSPHATE 10 MG/ML IJ SOLN
INTRAMUSCULAR | Status: AC
  Filled 2018-01-12: qty 1

## 2018-01-12 MED ORDER — FAMOTIDINE 20 MG PO TABS: 20.0000 mg | ORAL_TABLET | Freq: Once | ORAL | Status: DC

## 2018-01-12 SURGICAL SUPPLY — 61 items
BATTERY INSTRU NAVIGATION (MISCELLANEOUS) ×12 IMPLANT
BLADE SAGITTAL 25.0X1.19X90 (BLADE) ×2 IMPLANT
BLADE SAGITTAL 25.0X1.19X90MM (BLADE) ×1
BLADE SAW 1/2 (BLADE) ×3 IMPLANT
BLADE SAW 70X12.5 (BLADE) IMPLANT
CANISTER SUCT 1200ML W/VALVE (MISCELLANEOUS) ×3 IMPLANT
CANISTER SUCT 3000ML PPV (MISCELLANEOUS) ×6 IMPLANT
COOLER POLAR GLACIER W/PUMP (MISCELLANEOUS) ×3 IMPLANT
CUFF TOURN 24 STER (MISCELLANEOUS) IMPLANT
CUFF TOURN 30 STER DUAL PORT (MISCELLANEOUS) IMPLANT
DRAPE SHEET LG 3/4 BI-LAMINATE (DRAPES) ×3 IMPLANT
DRSG DERMACEA 8X12 NADH (GAUZE/BANDAGES/DRESSINGS) ×3 IMPLANT
DRSG OPSITE POSTOP 4X14 (GAUZE/BANDAGES/DRESSINGS) ×3 IMPLANT
DRSG TEGADERM 4X4.75 (GAUZE/BANDAGES/DRESSINGS) ×3 IMPLANT
DURAPREP 26ML APPLICATOR (WOUND CARE) ×6 IMPLANT
ELECT CAUTERY BLADE 6.4 (BLADE) ×3 IMPLANT
ELECT REM PT RETURN 9FT ADLT (ELECTROSURGICAL) ×3
ELECTRODE REM PT RTRN 9FT ADLT (ELECTROSURGICAL) ×1 IMPLANT
EX-PIN ORTHOLOCK NAV 4X150 (PIN) ×6 IMPLANT
GLOVE BIOGEL M STRL SZ7.5 (GLOVE) ×6 IMPLANT
GLOVE BIOGEL PI IND STRL 9 (GLOVE) ×1 IMPLANT
GLOVE BIOGEL PI INDICATOR 9 (GLOVE) ×2
GLOVE INDICATOR 8.0 STRL GRN (GLOVE) ×3 IMPLANT
GLOVE SURG SYN 9.0  PF PI (GLOVE) ×2
GLOVE SURG SYN 9.0 PF PI (GLOVE) ×1 IMPLANT
GOWN STRL REUS W/ TWL LRG LVL3 (GOWN DISPOSABLE) ×2 IMPLANT
GOWN STRL REUS W/TWL 2XL LVL3 (GOWN DISPOSABLE) ×3 IMPLANT
GOWN STRL REUS W/TWL LRG LVL3 (GOWN DISPOSABLE) ×4
HEMOVAC 400CC 10FR (MISCELLANEOUS) ×3 IMPLANT
HOLDER FOLEY CATH W/STRAP (MISCELLANEOUS) ×3 IMPLANT
HOOD PEEL AWAY FLYTE STAYCOOL (MISCELLANEOUS) ×6 IMPLANT
KIT TURNOVER KIT A (KITS) ×3 IMPLANT
KNIFE SCULPS 14X20 (INSTRUMENTS) ×3 IMPLANT
LABEL OR SOLS (LABEL) ×3 IMPLANT
NDL SAFETY ECLIPSE 18X1.5 (NEEDLE) ×1 IMPLANT
NEEDLE HYPO 18GX1.5 SHARP (NEEDLE) ×2
NEEDLE SPNL 20GX3.5 QUINCKE YW (NEEDLE) ×6 IMPLANT
NS IRRIG 500ML POUR BTL (IV SOLUTION) ×3 IMPLANT
PACK TOTAL KNEE (MISCELLANEOUS) ×3 IMPLANT
PAD WRAPON POLAR KNEE (MISCELLANEOUS) ×1 IMPLANT
PIN FIXATION 1/8DIA X 3INL (PIN) ×9 IMPLANT
PULSAVAC PLUS IRRIG FAN TIP (DISPOSABLE) ×3
SOL .9 NS 3000ML IRR  AL (IV SOLUTION) ×2
SOL .9 NS 3000ML IRR UROMATIC (IV SOLUTION) ×1 IMPLANT
SOL PREP PVP 2OZ (MISCELLANEOUS) ×3
SOLUTION PREP PVP 2OZ (MISCELLANEOUS) ×1 IMPLANT
SPONGE DRAIN TRACH 4X4 STRL 2S (GAUZE/BANDAGES/DRESSINGS) ×3 IMPLANT
STAPLER SKIN PROX 35W (STAPLE) ×3 IMPLANT
STRAP TIBIA SHORT (MISCELLANEOUS) ×3 IMPLANT
SUCTION FRAZIER HANDLE 10FR (MISCELLANEOUS) ×2
SUCTION TUBE FRAZIER 10FR DISP (MISCELLANEOUS) ×1 IMPLANT
SUT VIC AB 0 CT1 36 (SUTURE) ×3 IMPLANT
SUT VIC AB 1 CT1 36 (SUTURE) ×6 IMPLANT
SUT VIC AB 2-0 CT2 27 (SUTURE) ×3 IMPLANT
SYR 20CC LL (SYRINGE) ×3 IMPLANT
SYR 30ML LL (SYRINGE) ×6 IMPLANT
TIP FAN IRRIG PULSAVAC PLUS (DISPOSABLE) ×1 IMPLANT
TOWEL OR 17X26 4PK STRL BLUE (TOWEL DISPOSABLE) ×3 IMPLANT
TOWER CARTRIDGE SMART MIX (DISPOSABLE) ×3 IMPLANT
TRAY FOLEY MTR SLVR 16FR STAT (SET/KITS/TRAYS/PACK) ×3 IMPLANT
WRAPON POLAR PAD KNEE (MISCELLANEOUS) ×3

## 2018-01-12 NOTE — OR Nursing (Signed)
Pt states she has sores on both lower legs,  One on the left (operative side) near shin is still open and not healed and lower leg appears to be red.  Called OR room 1 to alert circulator RN and Dr Marry Guan.  Ellene Route to come assess.  Or Charge nurse notified.

## 2018-01-12 NOTE — Anesthesia Preprocedure Evaluation (Deleted)
Anesthesia Evaluation  Patient identified by MRN, date of birth, ID band Patient awake    Reviewed: Allergy & Precautions, H&P , NPO status , Patient's Chart, lab work & pertinent test results, reviewed documented beta blocker date and time   Airway Mallampati: II   Neck ROM: full    Dental  (+) Poor Dentition   Pulmonary neg pulmonary ROS, asthma ,    Pulmonary exam normal        Cardiovascular Exercise Tolerance: Good negative cardio ROS Normal cardiovascular exam Rhythm:regular Rate:Normal     Neuro/Psych PSYCHIATRIC DISORDERS Depression negative neurological ROS  negative psych ROS   GI/Hepatic negative GI ROS, Neg liver ROS,   Endo/Other  negative endocrine ROSdiabetesHypothyroidism   Renal/GU Renal diseasenegative Renal ROS  negative genitourinary   Musculoskeletal   Abdominal   Peds  Hematology negative hematology ROS (+)   Anesthesia Other Findings Past Medical History: No date: Abnormal uterine bleeding No date: Arthritis No date: Asthma No date: Depression No date: Diabetes mellitus without complication (HCC) No date: Eczema No date: History of kidney stones No date: Hyperlipidemia No date: Hypothyroidism No date: Kidney stones No date: Morbid obesity with BMI of 50.0-59.9, adult ([place]) No date: Rosacea No date: Urinary incontinence, mixed Past Surgical History: No date: CESAREAN SECTION No date: COLONOSCOPY 12/01/2017: COLONOSCOPY WITH PROPOFOL; N/A     Comment:  Procedure: COLONOSCOPY WITH PROPOFOL;  Surgeon: Manya Silvas, MD;  Location: [facility] ENDOSCOPY;  Service:               Endoscopy;  Laterality: N/A; No date: Excision left foot No date: LITHOTRIPSY   Reproductive/Obstetrics negative OB ROS                             Anesthesia Physical Anesthesia Plan  ASA: III  Anesthesia Plan: General and Spinal   Post-op Pain Management:     Induction:   PONV Risk Score and Plan: 4 or greater  Airway Management Planned:   Additional Equipment:   Intra-op Plan:   Post-operative Plan:   Informed Consent: I have reviewed the patients History and Physical, chart, labs and discussed the procedure including the risks, benefits and alternatives for the proposed anesthesia with the patient or authorized representative who has indicated his/her understanding and acceptance.   Dental Advisory Given  Plan Discussed with: CRNA  Anesthesia Plan Comments:         Anesthesia Quick Evaluation

## 2018-01-12 NOTE — OR Nursing (Signed)
Dr Marry Guan in to assess patient leg.  Decision to postpone surgery due to increased risk of complications.  Plan of care discussed in detail with patient.  Pt to contact office for further instructions for plan of care/appointments

## 2018-01-13 ENCOUNTER — Encounter (INDEPENDENT_AMBULATORY_CARE_PROVIDER_SITE_OTHER): Payer: Self-pay | Admitting: Vascular Surgery

## 2018-01-13 ENCOUNTER — Ambulatory Visit (INDEPENDENT_AMBULATORY_CARE_PROVIDER_SITE_OTHER): Payer: BLUE CROSS/BLUE SHIELD | Admitting: Vascular Surgery

## 2018-01-13 VITALS — BP 156/80 | HR 94 | Resp 15 | Ht 65.2 in | Wt 275.0 lb

## 2018-01-13 DIAGNOSIS — Z794 Long term (current) use of insulin: Secondary | ICD-10-CM

## 2018-01-13 DIAGNOSIS — Z23 Encounter for immunization: Secondary | ICD-10-CM | POA: Diagnosis not present

## 2018-01-13 DIAGNOSIS — L97201 Non-pressure chronic ulcer of unspecified calf limited to breakdown of skin: Secondary | ICD-10-CM

## 2018-01-13 DIAGNOSIS — Z7689 Persons encountering health services in other specified circumstances: Secondary | ICD-10-CM | POA: Diagnosis not present

## 2018-01-13 DIAGNOSIS — E785 Hyperlipidemia, unspecified: Secondary | ICD-10-CM | POA: Diagnosis not present

## 2018-01-13 DIAGNOSIS — I872 Venous insufficiency (chronic) (peripheral): Secondary | ICD-10-CM

## 2018-01-13 DIAGNOSIS — E119 Type 2 diabetes mellitus without complications: Secondary | ICD-10-CM

## 2018-01-13 DIAGNOSIS — L97221 Non-pressure chronic ulcer of left calf limited to breakdown of skin: Secondary | ICD-10-CM | POA: Diagnosis not present

## 2018-01-13 DIAGNOSIS — E11622 Type 2 diabetes mellitus with other skin ulcer: Secondary | ICD-10-CM | POA: Diagnosis not present

## 2018-01-13 DIAGNOSIS — L97209 Non-pressure chronic ulcer of unspecified calf with unspecified severity: Secondary | ICD-10-CM | POA: Insufficient documentation

## 2018-01-13 NOTE — Progress Notes (Signed)
Patient ID: Stephanie Mathews, female   DOB: April 22, 1961, 57 y.o.   MRN: [id number]  Chief Complaint  Patient presents with  . New Patient (Initial Visit)    Leg Edema    HPI Stephanie Mathews is a 57 y.o. female.  I am asked to see the patient by Dr. Marry Guan for evaluation of leg swelling and ulceration with stasis dermatitis.  The patient reports this has been an ongoing issue for about 10 years.  She had a knee replacement that was recently canceled secondary to an open ulceration on her left calf that was somewhat inflamed.  She has no known history of DVT or superficial thrombophlebitis.  She apparently had a parent and a grandparent who had venous stasis as well.  She reports some minor traumas creating wounds which are very slow to heal.  She has had episodes of weeping skin in her legs.  She describes no clear inciting event or causative factor that started the symptoms.  She has noticed worsening purplish discoloration of both lower legs for many years.  She has lost about 70 pounds and is scheduled for a joint replacement but continues to have these nonhealing ulcerations.   Past Medical History:  Diagnosis Date  . Abnormal uterine bleeding   . Arthritis   . Asthma   . Depression   . Diabetes mellitus without complication ([place])   . Eczema   . History of kidney stones   . Hyperlipidemia   . Hypothyroidism   . Kidney stones   . Morbid obesity with BMI of 50.0-59.9, adult ([place])   . Rosacea   . Urinary incontinence, mixed     Past Surgical History:  Procedure Laterality Date  . CESAREAN SECTION    . COLONOSCOPY    . COLONOSCOPY WITH PROPOFOL N/A 12/01/2017   Procedure: COLONOSCOPY WITH PROPOFOL;  Surgeon: Manya Silvas, MD;  Location: [facility] ENDOSCOPY;  Service: Endoscopy;  Laterality: N/A;  . Excision left foot    . LITHOTRIPSY     Family History Father with venous disease.  Grandmother with venous disease and thin skin No bleeding or clotting  disorders No aneurysms  Social History Social History   Tobacco Use  . Smoking status: Never Smoker  . Smokeless tobacco: Never Used  Substance Use Topics  . Alcohol use: Yes    Frequency: Never    Comment: rare  . Drug use: Never     Allergies  Allergen Reactions  . Actos [Pioglitazone]     Fluid retention   . Adhesive [Tape]     Peels skin off   . Ibuprofen Diarrhea and Nausea And Vomiting  . Januvia [Sitagliptin]     Headache   . Lisinopril     Dizziness   . Statins Diarrhea    Muscle cramps    . Vicodin [Hydrocodone-Acetaminophen] Nausea And Vomiting  . Byetta 10 Mcg Pen [Exenatide] Rash  . Olive Oil Rash    Globe oil= rash  . Peanut Oil Rash    Peanuts & peanut oil=rash    No current facility-administered medications for this visit.    No current outpatient medications on file.   Facility-Administered Medications Ordered in Other Visits  Medication Dose Route Frequency Provider Last Rate Last Dose  . 0.9 %  sodium chloride infusion   Intravenous Continuous Piscitello, Precious Haws, MD      . celecoxib (CELEBREX) capsule 400 mg  400 mg Oral Once Hooten, Laurice Record, MD      .  chlorhexidine (HIBICLENS) 4 % liquid 4 application  60 mL Topical Once Hooten, Laurice Record, MD      . dexamethasone (DECADRON) injection 8 mg  8 mg Intravenous Once Hooten, Laurice Record, MD      . famotidine (PEPCID) tablet 20 mg  20 mg Oral Once Piscitello, Precious Haws, MD      . gabapentin (NEURONTIN) capsule 300 mg  300 mg Oral Once Hooten, Laurice Record, MD          REVIEW OF SYSTEMS (Negative unless checked)  Constitutional: [x] Weight loss  [] Fever  [] Chills Cardiac: [] Chest pain   [] Chest pressure   [] Palpitations   [] Shortness of breath when laying flat   [] Shortness of breath at rest   [] Shortness of breath with exertion. Vascular:  [] Pain in legs with walking   [] Pain in legs at rest   [] Pain in legs when laying flat   [] Claudication   [] Pain in feet when walking  [] Pain in feet at  rest  [] Pain in feet when laying flat   [] History of DVT   [] Phlebitis   [x] Swelling in legs   [] Varicose veins   [x] Non-healing ulcers Pulmonary:   [] Uses home oxygen   [] Productive cough   [] Hemoptysis   [] Wheeze  [] COPD   [] Asthma Neurologic:  [] Dizziness  [] Blackouts   [] Seizures   [] History of stroke   [] History of TIA  [] Aphasia   [] Temporary blindness   [] Dysphagia   [] Weakness or numbness in arms   [] Weakness or numbness in legs Musculoskeletal:  [x] Arthritis   [] Joint swelling   [x] Joint pain   [] Low back pain Hematologic:  [] Easy bruising  [] Easy bleeding   [] Hypercoagulable state   [] Anemic  [] Hepatitis Gastrointestinal:  [] Blood in stool   [] Vomiting blood  [] Gastroesophageal reflux/heartburn   [] Abdominal pain Genitourinary:  [] Chronic kidney disease   [] Difficult urination  [] Frequent urination  [] Burning with urination   [] Hematuria Skin:  [] Rashes   [x] Ulcers   [x] Wounds Psychological:  [] History of anxiety   [x]  History of major depression.    Physical Exam BP (!) 156/80 (BP Location: Left Arm, Patient Position: Sitting)   Pulse 94   Resp 15   Ht 5' 5.2" (1.656 m)   Wt 275 lb (124.7 kg)   BMI 45.48 kg/m  Gen:  WD/WN, NAD Head: [place]/AT, No temporalis wasting Ear/Nose/Throat: Hearing grossly intact, nares w/o erythema or drainage, oropharynx w/o Erythema/Exudate Eyes: Conjunctiva clear, sclera non-icteric  Neck: trachea midline.   Pulmonary:  Good air movement, respirations not labored, no use of accessory muscles Cardiac: RRR, no JVD Vascular:  Vessel Right Left  Radial Palpable Palpable                          PT Not Palpable Not Palpable  DP 1+ Palpable 1+ Palpable   Gastrointestinal: soft, non-tender/non-distended.  Musculoskeletal: M/S 5/5 throughout.  Extremities without ischemic changes.  Moderate to severe stasis dermatitis changes to both lower extremities.  Irregular ulceration on the left anterior lateral calf in the mid upper portion.  No surrounding  erythema.  Shallow ulceration on the right medial lower leg just above the ankle.  Also without significant surrounding erythema.  1+ lower extremity edema. Neurologic: Sensation grossly intact in extremities.  Symmetrical.  Speech is fluent. Motor exam as listed above. Psychiatric: Judgment intact, Mood & affect appropriate for pt's clinical situation. Dermatologic: Calf ulcerations as above     Radiology No results found.  Labs Recent Results (from  the past 2160 hour(s))  Glucose, capillary     Status: Abnormal   Collection Time: 12/01/17  7:25 AM  Result Value Ref Range   Glucose-Capillary 207 (H) 65 - 99 mg/dL  C-reactive protein     Status: Abnormal   Collection Time: 12/31/17 10:09 AM  Result Value Ref Range   CRP 3.9 (H) <1.0 mg/dL    Comment: Performed at [facility] [address], [place], [place] [address]  Hemoglobin A1c     Status: Abnormal   Collection Time: 12/31/17 10:09 AM  Result Value Ref Range   Hgb A1c MFr Bld 6.9 (H) 4.8 - 5.6 %    Comment: (NOTE) Pre diabetes:          5.7%-6.4% Diabetes:              >6.4% Glycemic control for   <7.0% adults with diabetes    Mean Plasma Glucose 151.33 mg/dL    Comment: Performed at [facility] [address], [place], [place] [address]  Sedimentation rate     Status: None   Collection Time: 12/31/17 10:09 AM  Result Value Ref Range   Sed Rate 26 0 - 30 mm/hr    Comment: Performed at [facility], [address] [place], [place] [address]  Surgical pcr screen     Status: Abnormal   Collection Time: 12/31/17 10:09 AM  Result Value Ref Range   MRSA, PCR NEGATIVE NEGATIVE   Staphylococcus aureus POSITIVE (A) NEGATIVE    Comment: (NOTE) The Xpert SA Assay (FDA approved for NASAL specimens in patients 93 years of age and older), is one component of a comprehensive surveillance program. It is not intended to diagnose infection nor to guide or monitor treatment. Performed at [facility], [address] [place], [place] [address]   APTT     Status: None   Collection Time: 12/31/17 10:09 AM  Result Value Ref Range   aPTT 28 24 - 36 seconds    Comment: Performed at [facility], [address], [place] [address]  CBC     Status: Abnormal   Collection Time: 12/31/17 10:09 AM  Result Value Ref Range   WBC 7.0 3.6 - 11.0 K/uL   RBC 5.09 3.80 - 5.20 MIL/uL   Hemoglobin 16.1 (H) 12.0 - 16.0 g/dL   HCT 48.3 (H) 35.0 - 47.0 %   MCV 94.9 80.0 - 100.0 fL   MCH 31.5 26.0 - 34.0 pg   MCHC 33.2 32.0 - 36.0 g/dL   RDW 13.8 11.5 - 14.5 %   Platelets 244 150 - 440 K/uL    Comment: Performed at [facility], [address] [place], [place] [address]  Comprehensive metabolic panel     Status: Abnormal   Collection Time: 12/31/17 10:09 AM  Result Value Ref Range   Sodium 140 135 - 145 mmol/L   Potassium 4.2 3.5 - 5.1 mmol/L   Chloride 104 101 - 111 mmol/L   CO2 24 22 - 32 mmol/L   Glucose, Bld 150 (H) 65 - 99 mg/dL   BUN 31 (H) 6 - 20 mg/dL   Creatinine, Ser 1.09 (H) 0.44 - 1.00 mg/dL   Calcium 9.6 8.9 - 10.3 mg/dL   Total Protein 7.9 6.5 - 8.1 g/dL   Albumin 3.9 3.5 - 5.0 g/dL   AST 20 15 - 41 U/L   ALT 20 14 - 54 U/L   Alkaline Phosphatase 90  38 - 126 U/L   Total Bilirubin 0.8 0.3 - 1.2 mg/dL   GFR calc non Af Amer 56 (L) >60 mL/min   GFR calc Af Amer >60 >60 mL/min    Comment: (NOTE) The eGFR has been calculated using the CKD EPI equation. This calculation has not been validated in all clinical situations. eGFR's persistently <60 mL/min signify possible Chronic Kidney Disease.    Anion gap 12 5 - 15    Comment: Performed at [facility], [address] [place], [place] [address]  Protime-INR     Status: None   Collection Time: 12/31/17 10:09 AM  Result Value Ref Range   Prothrombin Time 11.5 11.4 - 15.2 seconds   INR 0.85     Comment: Performed at [facility], [address] [place], [place] [address]    Type and screen Order type and screen if day of surgery is less than 15 days from draw of preadmission visit or order morning of surgery if day of surgery is greater than 6 days from preadmission visit.     Status: None   Collection Time: 12/31/17 10:09 AM  Result Value Ref Range   ABO/RH(D) A POS    Antibody Screen NEG    Sample Expiration 01/14/2018    Extend sample reason      NO TRANSFUSIONS OR PREGNANCY IN THE PAST 3 MONTHS Performed at [facility], [address] [place], [place] [address]   Urinalysis, Routine w reflex microscopic     Status: Abnormal   Collection Time: 12/31/17 10:09 AM  Result Value Ref Range   Color, Urine STRAW (A) YELLOW   APPearance HAZY (A) CLEAR   Specific Gravity, Urine 1.018 1.005 - 1.030   pH 7.0 5.0 - 8.0   Glucose, UA >=500 (A) NEGATIVE mg/dL   Hgb urine dipstick MODERATE (A) NEGATIVE   Bilirubin Urine NEGATIVE NEGATIVE   Ketones, ur 5 (A) NEGATIVE mg/dL   Protein, ur NEGATIVE NEGATIVE mg/dL   Nitrite NEGATIVE NEGATIVE   Leukocytes, UA LARGE (A) NEGATIVE   RBC / HPF 21-50 0 - 5 RBC/hpf   WBC, UA >50 (H) 0 - 5 WBC/hpf   Bacteria, UA NONE SEEN NONE SEEN   Squamous Epithelial / LPF 0-5 0 - 5   Mucus PRESENT    Budding Yeast PRESENT     Comment: Performed at [facility], [address], [place], [place] [address]  Urine culture     Status: Abnormal   Collection Time: 12/31/17 10:09 AM  Result Value Ref Range   Specimen Description      URINE, RANDOM Performed at [facility], [address], [place], [place] [address]    Special Requests      Normal Performed at [facility], [address] [place], [place] [address]    Culture >=100,000 COLONIES/mL ESCHERICHIA COLI (A)    Report Status 01/02/2018 FINAL    Organism ID, Bacteria ESCHERICHIA COLI (A)       Susceptibility   Escherichia coli - MIC*    AMPICILLIN <=2 SENSITIVE Sensitive     CEFAZOLIN <=4 SENSITIVE Sensitive     CEFTRIAXONE <=1  SENSITIVE Sensitive     CIPROFLOXACIN >=4 RESISTANT Resistant     GENTAMICIN <=1 SENSITIVE Sensitive     IMIPENEM <=0.25 SENSITIVE Sensitive     NITROFURANTOIN <=16 SENSITIVE Sensitive     TRIMETH/SULFA <=20 SENSITIVE Sensitive     AMPICILLIN/SULBACTAM <=2 SENSITIVE Sensitive     PIP/TAZO <=  4 SENSITIVE Sensitive     Extended ESBL NEGATIVE Sensitive     * >=100,000 COLONIES/mL ESCHERICHIA COLI  Glucose, capillary     Status: Abnormal   Collection Time: 01/12/18  9:41 AM  Result Value Ref Range   Glucose-Capillary 172 (H) 70 - 99 mg/dL  ABO/Rh     Status: None   Collection Time: 01/12/18  9:49 AM  Result Value Ref Range   ABO/RH(D)      A POS Performed at [facility], [address] [place],  [address]     Assessment/Plan:  Diabetes mellitus type 2, uncomplicated ([place]) blood glucose control important in reducing the progression of atherosclerotic disease. Also, involved in wound healing. On appropriate medications.   Hyperlipidemia lipid control important in reducing the progression of atherosclerotic disease. Continue statin therapy   Venous stasis dermatitis of both lower extremities The patient has pretty significant venous stasis changes to both lower extremities.  We are going to be managing this with Unna boots to try to all her ulcerations going forward.  She will eventually need some sort of compression garment that does not tear her skin.  Once she is finished with her surgery we will plan a venous duplex to interrogate for reflux to determine if treatment options beyond conservative therapy will be helpful.  Lower limb ulcer, calf ([place]) He has bilateral lower extremity ulcerations.  A 3 layer Unna boot was placed today and will be changed weekly.  This should help control her swelling and improve her stasis dermatitis somewhat as well.  Venous work-up will be planned in the future after she has completed her joint replacement.  We are going to keep her  in Unna boots until she has her surgery.      Leotis Pain 01/13/2018, 11:31 AM   This note was created with Dragon medical transcription system.  Any errors from dictation are unintentional.

## 2018-01-13 NOTE — Patient Instructions (Signed)

## 2018-01-13 NOTE — Assessment & Plan Note (Signed)
He has bilateral lower extremity ulcerations.  A 3 layer Unna boot was placed today and will be changed weekly.  This should help control her swelling and improve her stasis dermatitis somewhat as well.  Venous work-up will be planned in the future after she has completed her joint replacement.  We are going to keep her in Unna boots until she has her surgery.

## 2018-01-13 NOTE — Assessment & Plan Note (Signed)
blood glucose control important in reducing the progression of atherosclerotic disease. Also, involved in wound healing. On appropriate medications.  

## 2018-01-13 NOTE — Assessment & Plan Note (Signed)
lipid control important in reducing the progression of atherosclerotic disease. Continue statin therapy  

## 2018-01-13 NOTE — Assessment & Plan Note (Signed)
The patient has pretty significant venous stasis changes to both lower extremities.  We are going to be managing this with Unna boots to try to all her ulcerations going forward.  She will eventually need some sort of compression garment that does not tear her skin.  Once she is finished with her surgery we will plan a venous duplex to interrogate for reflux to determine if treatment options beyond conservative therapy will be helpful.

## 2018-01-20 ENCOUNTER — Ambulatory Visit (INDEPENDENT_AMBULATORY_CARE_PROVIDER_SITE_OTHER): Payer: BLUE CROSS/BLUE SHIELD | Admitting: Vascular Surgery

## 2018-01-20 ENCOUNTER — Encounter (INDEPENDENT_AMBULATORY_CARE_PROVIDER_SITE_OTHER): Payer: Self-pay

## 2018-01-20 VITALS — BP 150/76 | HR 99 | Resp 16 | Ht 67.0 in | Wt 271.0 lb

## 2018-01-20 DIAGNOSIS — I872 Venous insufficiency (chronic) (peripheral): Secondary | ICD-10-CM | POA: Diagnosis not present

## 2018-01-20 NOTE — Progress Notes (Signed)
History of Present Illness  There is no documented history at this time  Assessments & Plan   There are no diagnoses linked to this encounter.    Additional instructions  Subjective:  Patient presents with venous ulcer of the Bilateral lower extremity.    Procedure:  3 layer unna wrap was placed Bilateral lower extremity.   Plan:   Follow up in one week.  

## 2018-01-27 ENCOUNTER — Encounter (INDEPENDENT_AMBULATORY_CARE_PROVIDER_SITE_OTHER): Payer: Self-pay

## 2018-01-27 ENCOUNTER — Ambulatory Visit (INDEPENDENT_AMBULATORY_CARE_PROVIDER_SITE_OTHER): Payer: BLUE CROSS/BLUE SHIELD | Admitting: Vascular Surgery

## 2018-01-27 VITALS — BP 133/81 | HR 104 | Resp 16 | Ht 64.0 in | Wt 270.0 lb

## 2018-01-27 DIAGNOSIS — L97211 Non-pressure chronic ulcer of right calf limited to breakdown of skin: Secondary | ICD-10-CM

## 2018-01-27 DIAGNOSIS — L97221 Non-pressure chronic ulcer of left calf limited to breakdown of skin: Secondary | ICD-10-CM | POA: Diagnosis not present

## 2018-01-27 DIAGNOSIS — L97201 Non-pressure chronic ulcer of unspecified calf limited to breakdown of skin: Secondary | ICD-10-CM

## 2018-01-27 NOTE — Progress Notes (Signed)
History of Present Illness  There is no documented history at this time  Assessments & Plan   There are no diagnoses linked to this encounter.    Additional instructions  Subjective:  Patient presents with venous ulcer of the Bilateral lower extremity.    Procedure:  3 layer unna wrap was placed Bilateral lower extremity.   Plan:   Follow up in one week.  

## 2018-01-30 DIAGNOSIS — E559 Vitamin D deficiency, unspecified: Secondary | ICD-10-CM | POA: Diagnosis not present

## 2018-01-30 DIAGNOSIS — E538 Deficiency of other specified B group vitamins: Secondary | ICD-10-CM | POA: Diagnosis not present

## 2018-01-30 DIAGNOSIS — Z794 Long term (current) use of insulin: Secondary | ICD-10-CM | POA: Diagnosis not present

## 2018-01-30 DIAGNOSIS — E119 Type 2 diabetes mellitus without complications: Secondary | ICD-10-CM | POA: Diagnosis not present

## 2018-01-30 DIAGNOSIS — E782 Mixed hyperlipidemia: Secondary | ICD-10-CM | POA: Diagnosis not present

## 2018-02-03 ENCOUNTER — Encounter (INDEPENDENT_AMBULATORY_CARE_PROVIDER_SITE_OTHER): Payer: Self-pay | Admitting: Vascular Surgery

## 2018-02-03 ENCOUNTER — Ambulatory Visit (INDEPENDENT_AMBULATORY_CARE_PROVIDER_SITE_OTHER): Payer: BLUE CROSS/BLUE SHIELD | Admitting: Vascular Surgery

## 2018-02-03 VITALS — BP 133/76 | HR 100 | Resp 15 | Ht 66.0 in | Wt 270.0 lb

## 2018-02-03 DIAGNOSIS — I872 Venous insufficiency (chronic) (peripheral): Secondary | ICD-10-CM

## 2018-02-03 DIAGNOSIS — L97201 Non-pressure chronic ulcer of unspecified calf limited to breakdown of skin: Secondary | ICD-10-CM | POA: Diagnosis not present

## 2018-02-03 DIAGNOSIS — E119 Type 2 diabetes mellitus without complications: Secondary | ICD-10-CM | POA: Diagnosis not present

## 2018-02-03 DIAGNOSIS — Z794 Long term (current) use of insulin: Secondary | ICD-10-CM | POA: Diagnosis not present

## 2018-02-03 NOTE — Progress Notes (Signed)
Subjective:    Patient ID: Stephanie Mathews, female    DOB: Jan 10, 1961, 57 y.o.   MRN: [id number] Chief Complaint  Patient presents with  . Follow-up    Bilateral Unna boot check   Patient presents for a monthly unna boot / wound check.  The patient was scheduled to undergo any replacement surgery however was found to have venous ulcerations and her surgery was postponed.  The patient has spent approximately three weeks and 3 layer zinc oxide Unna wraps for bilateral lower extremity ulcerations.  The patient presents today stating her bilateral lower extremity edema has improved and her ulcerations have healed.  She denies any new ulcer development.  Patient denies any fever, nausea vomiting.  Review of Systems  Constitutional: Negative.   HENT: Negative.   Eyes: Negative.   Respiratory: Negative.   Cardiovascular: Positive for leg swelling.  Gastrointestinal: Negative.   Endocrine: Negative.   Genitourinary: Negative.   Musculoskeletal: Negative.   Skin: Positive for wound.  Allergic/Immunologic: Negative.   Neurological: Negative.   Hematological: Negative.   Psychiatric/Behavioral: Negative.       Objective:   Physical Exam  Constitutional: She is oriented to person, place, and time. She appears well-developed and well-nourished. No distress.  HENT:  Head: Normocephalic and atraumatic.  Right Ear: External ear normal.  Left Ear: External ear normal.  Eyes: Pupils are equal, round, and reactive to light. Conjunctivae and EOM are normal.  Neck: Normal range of motion.  Cardiovascular: Normal rate, regular rhythm and normal heart sounds.  Pulmonary/Chest: Effort normal and breath sounds normal.  Musculoskeletal: Normal range of motion. She exhibits edema (Minimal edema noted to the bilateral lower extremity).  Neurological: She is alert and oriented to person, place, and time.  Skin: She is not diaphoretic.  Patient with severe stasis dermatitis to the bilateral  lower extremity.  The patient's ulcerations have healed.  There are no active ulcerations or cellulitis noted to the bilateral lower extremity.  Psychiatric: She has a normal mood and affect. Her behavior is normal. Judgment and thought content normal.  Vitals reviewed.  BP 133/76 (BP Location: Left Arm, Patient Position: Sitting)   Pulse 100   Resp 15   Ht 5\' 6"  (1.676 m)   Wt 270 lb (122.5 kg)   BMI 43.58 kg/m   Past Medical History:  Diagnosis Date  . Abnormal uterine bleeding   . Arthritis   . Asthma   . Depression   . Diabetes mellitus without complication ([place])   . Eczema   . History of kidney stones   . Hyperlipidemia   . Hypothyroidism   . Kidney stones   . Morbid obesity with BMI of 50.0-59.9, adult ([place])   . Rosacea   . Urinary incontinence, mixed    Social History   Socioeconomic History  . Marital status: Married    Spouse name: Not on file  . Number of children: Not on file  . Years of education: Not on file  . Highest education level: Not on file  Occupational History  . Not on file  Social Needs  . Financial resource strain: Not on file  . Food insecurity:    Worry: Not on file    Inability: Not on file  . Transportation needs:    Medical: Not on file    Non-medical: Not on file  Tobacco Use  . Smoking status: Never Smoker  . Smokeless tobacco: Never Used  Substance and Sexual Activity  . Alcohol use:  Yes    Frequency: Never    Comment: rare  . Drug use: Never  . Sexual activity: Not on file  Lifestyle  . Physical activity:    Days per week: Not on file    Minutes per session: Not on file  . Stress: Not on file  Relationships  . Social connections:    Talks on phone: Not on file    Gets together: Not on file    Attends religious service: Not on file    Active member of club or organization: Not on file    Attends meetings of clubs or organizations: Not on file    Relationship status: Not on file  . Intimate partner violence:    Fear  of current or ex partner: Not on file    Emotionally abused: Not on file    Physically abused: Not on file    Forced sexual activity: Not on file  Other Topics Concern  . Not on file  Social History Narrative  . Not on file   Past Surgical History:  Procedure Laterality Date  . CESAREAN SECTION    . COLONOSCOPY    . COLONOSCOPY WITH PROPOFOL N/A 12/01/2017   Procedure: COLONOSCOPY WITH PROPOFOL;  Surgeon: Manya Silvas, MD;  [facility];  Service: Endoscopy;  Laterality: N/A;  . Excision left foot    . LITHOTRIPSY     History reviewed. No pertinent family history.  Allergies  Allergen Reactions  . Chlorhexidine Itching and Rash  . Actos [Pioglitazone]     Fluid retention   . Adhesive [Tape]     Peels skin off   . Ibuprofen Diarrhea and Nausea And Vomiting  . Januvia [Sitagliptin]     Headache   . Lisinopril     Dizziness   . Statins Diarrhea    Muscle cramps    . Vicodin [Hydrocodone-Acetaminophen] Nausea And Vomiting  . Byetta 10 Mcg Pen [Exenatide] Rash  . Olive Oil Rash    [place] oil= rash  . Peanut Oil Rash    Peanuts & peanut oil=rash      Assessment & Plan:  Patient presents for a monthly unna boot / wound check.  The patient was scheduled to undergo any replacement surgery however was found to have venous ulcerations and her surgery was postponed.  The patient has spent approximately three weeks and 3 layer zinc oxide Unna wraps for bilateral lower extremity ulcerations.  The patient presents today stating her bilateral lower extremity edema has improved and her ulcerations have healed.  She denies any new ulcer development.  Patient denies any fever, nausea vomiting.  1. Venous stasis dermatitis of both lower extremities - Stable Due to the patient's severe stasis dermatitis located to the bilateral lower extremity she is at high risk for ulcer development The patient was encouraged to wear graduated compression stockings (20-30  mmHg) on a daily basis. The patient was instructed to begin wearing the stockings first thing in the morning and removing them in the evening. The patient was instructed specifically not to sleep in the stockings. Prescription given.  We also discussed Farrow wraps as an option In addition, behavioral modification including elevation during the day will be initiated. Recommend the patient come back in 2 months and undergo a bilateral lower extremity venous duplex to rule out any venous versus lymphatic disease.   At this point, the patient would like to move forward with her knee replacement surgery and contact our office after  to move forward with a venous work-up  information on compression stockings was given to the patient. The patient was instructed to call the office in the interim if any worsening edema or ulcerations to the legs, feet or toes occurs. The patient expresses their understanding.  2. Type 2 diabetes mellitus without complication, with long-term current use of insulin (HCC) - Stable Encouraged good control as its slows the progression of atherosclerotic disease  3. Skin ulcer of calf, limited to breakdown of skin, unspecified laterality ([place]) - Resolved As above  Current Outpatient Medications on File Prior to Visit  Medication Sig Dispense Refill  . Ascorbic Acid (VITAMIN C GUMMIE PO) Take 2 tablets by mouth 2 (two) times daily.    Marland Kitchen aspirin 81 MG chewable tablet Chew 81 mg by mouth daily with lunch.     Marland Kitchen BACILLUS COAGULANS-INULIN PO Take 2 tablets by mouth at bedtime.    . Calcium Citrate-Vitamin D (CALCIUM CITRATE + D3 PO) Take 1 tablet by mouth every evening.    . carboxymethylcellulose (REFRESH TEARS) 0.5 % SOLN Place 1 drop into both eyes 4 (four) times daily as needed (at least 2 times daily--for dry eyes.).    Marland Kitchen Cholecalciferol (VITAMIN D3) 5000 units TABS Take 5,000 Units by mouth every evening.    . Chromium Picolinate 200 MCG TABS Take 200 mcg by mouth every  evening.    . clotrimazole-betamethasone (LOTRISONE) cream Apply 1 application topically 2 (two) times daily as needed (for yeast on skin.).    Marland Kitchen Cyanocobalamin (B-12) 5000 MCG SUBL Place 5,000 mcg under the tongue 2 (two) times daily.    . D-Mannose POWD Take 10 mLs by mouth daily. 2 teaspoonful daily    . dapagliflozin propanediol (FARXIGA) 10 MG TABS tablet Take 10 mg by mouth daily.    . ergocalciferol (VITAMIN D2) 50000 units capsule Take 50,000 Units by mouth every 14 (fourteen) days. Sundays in the morning    . fluconazole (DIFLUCAN) 200 MG tablet Take 200 mg by mouth every Sunday. In the morning.    . insulin aspart (NOVOLOG FLEXPEN) 100 UNIT/ML FlexPen Inject 3-6 Units into the skin 4 (four) times daily - after meals and at bedtime.     Marland Kitchen ivermectin (STROMECTOL) 3 MG TABS tablet Take 21 mg by mouth See admin instructions. Use 2-3 times a year (take 7 tablets (21 mg) by once a week, then repeat dose once after 1 week)    . levothyroxine (SYNTHROID, LEVOTHROID) 25 MCG tablet Take 25 mcg by mouth daily before breakfast. On an empty stomach    . liraglutide (VICTOZA) 18 MG/3ML SOPN Inject 1.8 mg into the skin daily. In the morning.    . loratadine (CLARITIN) 10 MG tablet Take 10 mg by mouth daily.    Marland Kitchen losartan (COZAAR) 25 MG tablet Take 12.5 mg by mouth daily.     . Melatonin 5 MG TABS Take 5 mg by mouth at bedtime.    . metFORMIN (GLUCOPHAGE-XR) 500 MG 24 hr tablet Take 1,000 mg by mouth 2 (two) times daily.    . metroNIDAZOLE (METROGEL) 1 % gel Apply 1 application topically daily as needed (for skin wound to assist with healing.).    Marland Kitchen Misc Natural Products (OSTEO BI-FLEX JOINT SHIELD PO) Take 1 tablet by mouth every evening.    . Multiple Vitamin (MULTIVITAMIN WITH MINERALS) TABS tablet Take 1 tablet by mouth daily. Centrum silver    . mupirocin nasal ointment (BACTROBAN) 2 % Place into the nose.    Marland Kitchen  niacin (SLO-NIACIN) 500 MG tablet Take 250 mg by mouth daily with lunch.    .  oxyCODONE-acetaminophen (PERCOCET) 10-325 MG tablet Take 0.25 tablets by mouth every 4 (four) hours as needed for pain.    Vladimir Faster Glycol-Propyl Glycol 0.4-0.3 % SOLN Place 1 drop into both eyes 4 (four) times daily as needed (for dry/irritated eyes.). Systane    . potassium citrate (UROCIT-K) 10 MEQ (1080 MG) SR tablet Take 10 mEq by mouth 2 (two) times daily.     . Probiotic Product (DIGESTIVE ADVANTAGE PO) Take 1 capsule by mouth 2 (two) times daily.    . pseudoephedrine (SUDAFED) 30 MG tablet Take 60 mg by mouth daily as needed for congestion (for sinus congestion.).    Marland Kitchen [employer] [place] WORT PO Take 5 mLs by mouth See admin instructions. Take 2-3 times a month for stress relief    . Sulfacetamide Sodium-Sulfur (AVAR LS) 10-2 % FOAM Apply topically 2 (two) times daily as needed.    . Turmeric POWD Take 10 mLs by mouth daily. 2 teaspoonfuls at night.     No current facility-administered medications on file prior to visit.    There are no Patient Instructions on file for this visit. No follow-ups on file.  Michiah Mudry A Sanders Manninen, PA-C

## 2018-02-10 ENCOUNTER — Ambulatory Visit (INDEPENDENT_AMBULATORY_CARE_PROVIDER_SITE_OTHER): Payer: BLUE CROSS/BLUE SHIELD | Admitting: Vascular Surgery

## 2018-03-30 DIAGNOSIS — Z01419 Encounter for gynecological examination (general) (routine) without abnormal findings: Secondary | ICD-10-CM | POA: Diagnosis not present

## 2018-03-30 DIAGNOSIS — Z1211 Encounter for screening for malignant neoplasm of colon: Secondary | ICD-10-CM | POA: Diagnosis not present

## 2018-03-30 DIAGNOSIS — Z6841 Body Mass Index (BMI) 40.0 and over, adult: Secondary | ICD-10-CM | POA: Diagnosis not present

## 2018-04-07 ENCOUNTER — Other Ambulatory Visit: Payer: Self-pay | Admitting: Obstetrics and Gynecology

## 2018-04-07 DIAGNOSIS — Z1231 Encounter for screening mammogram for malignant neoplasm of breast: Secondary | ICD-10-CM

## 2018-04-09 DIAGNOSIS — Z1211 Encounter for screening for malignant neoplasm of colon: Secondary | ICD-10-CM | POA: Diagnosis not present

## 2018-04-12 NOTE — Discharge Instructions (Signed)
°  Instructions after Total Knee Replacement ° ° Marlyne Totaro P. Roxane Puerto, Jr., M.D.    ° Dept. of Orthopaedics & Sports Medicine ° [facility] ° [address] ° [place], [place]  [address] ° Phone: [phone]   Fax: [phone] ° °  °DIET: °• Drink plenty of non-alcoholic fluids. °• Resume your normal diet. Include foods high in fiber. ° °ACTIVITY:  °• You may use crutches or a walker with weight-bearing as tolerated, unless instructed otherwise. °• You may be weaned off of the walker or crutches by your Physical Therapist.  °• Do NOT place pillows under the knee. Anything placed under the knee could limit your ability to straighten the knee.   °• Continue doing gentle exercises. Exercising will reduce the pain and swelling, increase motion, and prevent muscle weakness.   °• Please continue to use the TED compression stockings for 6 weeks. You may remove the stockings at night, but should reapply them in the morning. °• Do not drive or operate any equipment until instructed. ° °WOUND CARE:  °• Continue to use the PolarCare or ice packs periodically to reduce pain and swelling. °• You may bathe or shower after the staples are removed at the first office visit following surgery. ° °MEDICATIONS: °• You may resume your regular medications. °• Please take the pain medication as prescribed on the medication. °• Do not take pain medication on an empty stomach. °• You have been given a prescription for a blood thinner (Lovenox or Coumadin). Please take the medication as instructed. (NOTE: After completing a 2 week course of Lovenox, take one Enteric-coated aspirin once a day. This along with elevation will help reduce the possibility of phlebitis in your operated leg.) °• Do not drive or drink alcoholic beverages when taking pain medications. ° °CALL THE OFFICE FOR: °• Temperature above 101 degrees °• Excessive bleeding or drainage on the dressing. °• Excessive swelling, coldness, or paleness of the toes. °• Persistent  nausea and vomiting. ° °FOLLOW-UP:  °• You should have an appointment to return to the office in 10-14 days after surgery. °• Arrangements have been made for continuation of Physical Therapy (either home therapy or outpatient therapy). °  °

## 2018-04-14 ENCOUNTER — Encounter
Admission: RE | Admit: 2018-04-14 | Discharge: 2018-04-14 | Disposition: A | Discharge status: Home / Self Care | Payer: BLUE CROSS/BLUE SHIELD | Source: Ambulatory Visit | Attending: Orthopedic Surgery | Admitting: Orthopedic Surgery

## 2018-04-14 ENCOUNTER — Other Ambulatory Visit: Payer: Self-pay

## 2018-04-14 DIAGNOSIS — Z01812 Encounter for preprocedural laboratory examination: Secondary | ICD-10-CM | POA: Diagnosis not present

## 2018-04-14 HISTORY — DX: Acute myocardial infarction, unspecified: I21.9

## 2018-04-14 LAB — URINALYSIS, ROUTINE W REFLEX MICROSCOPIC
Bacteria, UA: NONE SEEN
Bilirubin Urine: NEGATIVE
Glucose, UA: >=500 mg/dL — AB
KETONES UR: 5 mg/dL — AB
Nitrite: NEGATIVE
PROTEIN: NEGATIVE mg/dL
Specific Gravity, Urine: 1.017 (ref 1.005–1.030)
pH: 6 (ref 5.0–8.0)

## 2018-04-14 LAB — COMPREHENSIVE METABOLIC PANEL
ALBUMIN: 4.3 g/dL (ref 3.5–5.0)
ALK PHOS: 117 U/L (ref 38–126)
ALT: 23 U/L (ref 0–44)
ANION GAP: 11 (ref 5–15)
AST: 21 U/L (ref 15–41)
BUN: 29 mg/dL — ABNORMAL HIGH (ref 6–20)
CALCIUM: 9.9 mg/dL (ref 8.9–10.3)
CO2: 25 mmol/L (ref 22–32)
Chloride: 102 mmol/L (ref 98–111)
Creatinine, Ser: 0.98 mg/dL (ref 0.44–1.00)
GFR calc non Af Amer: >60 mL/min (ref >60)
Glucose, Bld: 109 mg/dL — ABNORMAL HIGH (ref 70–99)
POTASSIUM: 4 mmol/L (ref 3.5–5.1)
SODIUM: 138 mmol/L (ref 135–145)
TOTAL PROTEIN: 8 g/dL (ref 6.5–8.1)
Total Bilirubin: 0.7 mg/dL (ref 0.3–1.2)

## 2018-04-14 LAB — CBC
HCT: 47.3 % — ABNORMAL HIGH (ref 35.0–47.0)
HEMOGLOBIN: 16.4 g/dL — AB (ref 12.0–16.0)
MCH: 32.1 pg (ref 26.0–34.0)
MCHC: 34.7 g/dL (ref 32.0–36.0)
MCV: 92.4 fL (ref 80.0–100.0)
Platelets: 293 10*3/uL (ref 150–440)
RBC: 5.11 MIL/uL (ref 3.80–5.20)
RDW: 13.9 % (ref 11.5–14.5)
WBC: 7 10*3/uL (ref 3.6–11.0)

## 2018-04-14 LAB — SURGICAL PCR SCREEN
MRSA, PCR: NEGATIVE
Staphylococcus aureus: POSITIVE — AB

## 2018-04-14 LAB — C-REACTIVE PROTEIN: CRP: 4 mg/dL — AB (ref <1.0)

## 2018-04-14 LAB — APTT: APTT: 28 s (ref 24–36)

## 2018-04-14 LAB — SEDIMENTATION RATE: SED RATE: 18 mm/h (ref 0–30)

## 2018-04-14 LAB — TYPE AND SCREEN
ABO/RH(D): A POS
ANTIBODY SCREEN: NEGATIVE

## 2018-04-14 LAB — PROTIME-INR
INR: 0.95
Prothrombin Time: 12.6 seconds (ref 11.4–15.2)

## 2018-04-14 NOTE — Patient Instructions (Signed)
Your procedure is scheduled on: Monday 04/27/18 Report to [place]. To find out your arrival time please call [phone] between 1PM - 3PM on Friday 04/24/18 .  Remember: Instructions that are not followed completely may result in serious medical risk, up to and including death, or upon the discretion of your surgeon and anesthesiologist your surgery may need to be rescheduled.     _X__ 1. Do not eat food after midnight the night before your procedure.                 No gum chewing or hard candies. You may drink clear liquids up to 2 hours                 before you are scheduled to arrive for your surgery- DO not drink clear                 liquids within 2 hours of the start of your surgery.                 Clear Liquids include:  water, apple juice without pulp, clear carbohydrate                 drink such as Clearfast or Gatorade, Black Coffee or Tea (Do not add                 anything to coffee or tea).  __X__2.  On the morning of surgery brush your teeth with toothpaste and water, you                 may rinse your mouth with mouthwash if you wish.  Do not swallow any              toothpaste of mouthwash.     _X__ 3.  No Alcohol for 24 hours before or after surgery.   _X__ 4.  Do Not Smoke or use e-cigarettes For 24 Hours Prior to Your Surgery.                 Do not use any chewable tobacco products for at least 6 hours prior to                 surgery.  ____  5.  Bring all medications with you on the day of surgery if instructed.   __X__  6.  Notify your doctor if there is any change in your medical condition      (cold, fever, infections).     Do not wear jewelry, make-up, hairpins, clips or nail polish. Do not wear lotions, powders, or perfumes.  Do not shave 48 hours prior to surgery. Men may shave face and neck. Do not bring valuables to the hospital.    [facility] is not responsible for any belongings or  valuables.  Contacts, dentures/partials or body piercings may not be worn into surgery. Bring a case for your contacts, glasses or hearing aids, a denture cup will be supplied. Leave your suitcase in the car. After surgery it may be brought to your room. For patients admitted to the hospital, discharge time is determined by your treatment team.   Patients discharged the day of surgery will not be allowed to drive home.   Please read over the following fact sheets that you were given:   MRSA Information  __X__ Take these medicines the morning of surgery with A SIP OF WATER:  1. levothyroxine (SYNTHROID, LEVOTHROID)  2. loratadine (CLARITIN)  3.   4.  5.  6.  ____ Fleet Enema (as directed)   __X__ Use CHG Soap/SAGE wipes as directed  ____ Use inhalers on the day of surgery  __X__ Stop metformin/Janumet/Farxiga 2 days prior to surgery    __X__ Take 1/2 of usual insulin dose the night before surgery. No insulin the morning          of surgery.   ____ Stop Blood Thinners Coumadin/Plavix/Xarelto/Pleta/Pradaxa/Eliquis/Effient/Aspirin  on   Or contact your Surgeon, Cardiologist or Medical Doctor regarding  ability to stop your blood thinners  __X__ Stop Anti-inflammatories 7 days before surgery such as Advil, Ibuprofen, Motrin,  BC or Goodies Powder, Naprosyn, Naproxen, Aleve, Aspirin    __X__ Stop all herbal supplements, fish oil or vitamin E until after surgery.    ____ Bring C-Pap to the hospital.

## 2018-04-15 NOTE — Pre-Procedure Instructions (Signed)
LABS FAXED TO DR HOOTEN 

## 2018-04-16 ENCOUNTER — Ambulatory Visit
Admission: RE | Admit: 2018-04-16 | Discharge: 2018-04-16 | Disposition: A | Discharge status: Home / Self Care | Payer: BLUE CROSS/BLUE SHIELD | Source: Ambulatory Visit | Attending: Obstetrics and Gynecology | Admitting: Obstetrics and Gynecology

## 2018-04-16 DIAGNOSIS — Z1231 Encounter for screening mammogram for malignant neoplasm of breast: Secondary | ICD-10-CM | POA: Insufficient documentation

## 2018-04-16 LAB — URINE CULTURE
Culture: >=100000 — AB
Special Requests: NORMAL

## 2018-04-16 LAB — HEMOGLOBIN A1C
Hgb A1c MFr Bld: 7.8 % — ABNORMAL HIGH (ref 4.8–5.6)
Mean Plasma Glucose: 177 mg/dL

## 2018-04-20 DIAGNOSIS — E559 Vitamin D deficiency, unspecified: Secondary | ICD-10-CM | POA: Diagnosis not present

## 2018-04-20 DIAGNOSIS — E1129 Type 2 diabetes mellitus with other diabetic kidney complication: Secondary | ICD-10-CM | POA: Diagnosis not present

## 2018-04-20 DIAGNOSIS — F341 Dysthymic disorder: Secondary | ICD-10-CM | POA: Diagnosis not present

## 2018-04-26 ENCOUNTER — Encounter: Payer: Self-pay | Admitting: Orthopedic Surgery

## 2018-04-26 MED ORDER — CEFAZOLIN SODIUM-DEXTROSE 2-4 GM/100ML-% IV SOLN
2.0000 g | INTRAVENOUS | Status: AC
  Administered 2018-04-27: 2 g via INTRAVENOUS

## 2018-04-26 MED ORDER — TRANEXAMIC ACID-NACL 1000-0.7 MG/100ML-% IV SOLN
1000.0000 mg | INTRAVENOUS | Status: DC
  Filled 2018-04-26: qty 100

## 2018-04-27 ENCOUNTER — Inpatient Hospital Stay: Payer: BLUE CROSS/BLUE SHIELD | Admitting: Certified Registered Nurse Anesthetist

## 2018-04-27 ENCOUNTER — Encounter
Admission: RE | Disposition: A | Discharge status: Home Health | Payer: Self-pay | Source: Ambulatory Visit | Attending: Orthopedic Surgery

## 2018-04-27 ENCOUNTER — Other Ambulatory Visit: Payer: Self-pay

## 2018-04-27 ENCOUNTER — Inpatient Hospital Stay
Admission: RE | Admit: 2018-04-27 | Discharge: 2018-04-29 | DRG: 470 | Disposition: A | Discharge status: Home Health | Payer: BLUE CROSS/BLUE SHIELD | Source: Ambulatory Visit | Attending: Orthopedic Surgery | Admitting: Orthopedic Surgery

## 2018-04-27 ENCOUNTER — Inpatient Hospital Stay: Payer: BLUE CROSS/BLUE SHIELD

## 2018-04-27 ENCOUNTER — Encounter: Payer: Self-pay | Admitting: *Deleted

## 2018-04-27 DIAGNOSIS — E119 Type 2 diabetes mellitus without complications: Secondary | ICD-10-CM | POA: Diagnosis not present

## 2018-04-27 DIAGNOSIS — Z794 Long term (current) use of insulin: Secondary | ICD-10-CM | POA: Diagnosis not present

## 2018-04-27 DIAGNOSIS — Z96659 Presence of unspecified artificial knee joint: Secondary | ICD-10-CM

## 2018-04-27 DIAGNOSIS — Z7982 Long term (current) use of aspirin: Secondary | ICD-10-CM

## 2018-04-27 DIAGNOSIS — Z6841 Body Mass Index (BMI) 40.0 and over, adult: Secondary | ICD-10-CM | POA: Diagnosis not present

## 2018-04-27 DIAGNOSIS — J45909 Unspecified asthma, uncomplicated: Secondary | ICD-10-CM | POA: Diagnosis present

## 2018-04-27 DIAGNOSIS — Z888 Allergy status to other drugs, medicaments and biological substances status: Secondary | ICD-10-CM | POA: Diagnosis not present

## 2018-04-27 DIAGNOSIS — E785 Hyperlipidemia, unspecified: Secondary | ICD-10-CM | POA: Diagnosis present

## 2018-04-27 DIAGNOSIS — Z79899 Other long term (current) drug therapy: Secondary | ICD-10-CM

## 2018-04-27 DIAGNOSIS — Z885 Allergy status to narcotic agent status: Secondary | ICD-10-CM

## 2018-04-27 DIAGNOSIS — E039 Hypothyroidism, unspecified: Secondary | ICD-10-CM | POA: Diagnosis not present

## 2018-04-27 DIAGNOSIS — I252 Old myocardial infarction: Secondary | ICD-10-CM

## 2018-04-27 DIAGNOSIS — Z471 Aftercare following joint replacement surgery: Secondary | ICD-10-CM | POA: Diagnosis not present

## 2018-04-27 DIAGNOSIS — Z96652 Presence of left artificial knee joint: Secondary | ICD-10-CM | POA: Diagnosis not present

## 2018-04-27 DIAGNOSIS — M1712 Unilateral primary osteoarthritis, left knee: Secondary | ICD-10-CM | POA: Diagnosis not present

## 2018-04-27 HISTORY — PX: KNEE ARTHROPLASTY: SHX992

## 2018-04-27 LAB — GLUCOSE, CAPILLARY
GLUCOSE-CAPILLARY: 217 mg/dL — AB (ref 70–99)
GLUCOSE-CAPILLARY: 326 mg/dL — AB (ref 70–99)
GLUCOSE-CAPILLARY: 352 mg/dL — AB (ref 70–99)
Glucose-Capillary: 183 mg/dL — ABNORMAL HIGH (ref 70–99)
Glucose-Capillary: 209 mg/dL — ABNORMAL HIGH (ref 70–99)

## 2018-04-27 SURGERY — ARTHROPLASTY, KNEE, TOTAL, USING IMAGELESS COMPUTER-ASSISTED NAVIGATION
Anesthesia: General | Laterality: Left

## 2018-04-27 MED ORDER — PROPOFOL 500 MG/50ML IV EMUL
INTRAVENOUS | Status: AC
  Filled 2018-04-27: qty 50

## 2018-04-27 MED ORDER — MIDAZOLAM HCL 2 MG/2ML IJ SOLN
INTRAMUSCULAR | Status: AC
  Filled 2018-04-27: qty 2

## 2018-04-27 MED ORDER — SODIUM CHLORIDE 0.9 % IV SOLN
INTRAVENOUS | Status: DC | PRN
  Administered 2018-04-27: 30 ug/min via INTRAVENOUS

## 2018-04-27 MED ORDER — LOSARTAN POTASSIUM 25 MG PO TABS
12.5000 mg | ORAL_TABLET | Freq: Every day | ORAL | Status: DC
  Administered 2018-04-28 – 2018-04-29 (×2): 12.5 mg via ORAL
  Filled 2018-04-27 (×2): qty 1

## 2018-04-27 MED ORDER — MAGNESIUM HYDROXIDE 400 MG/5ML PO SUSP
30.0000 mL | Freq: Every day | ORAL | Status: DC
  Administered 2018-04-28 – 2018-04-29 (×2): 30 mL via ORAL
  Filled 2018-04-27 (×2): qty 30

## 2018-04-27 MED ORDER — DEXTROSE 5 % IV SOLN
3.0000 g | Freq: Four times a day (QID) | INTRAVENOUS | Status: AC
  Administered 2018-04-27 – 2018-04-28 (×4): 3 g via INTRAVENOUS
  Filled 2018-04-27 (×4): qty 3

## 2018-04-27 MED ORDER — ACETAMINOPHEN 325 MG PO TABS: 325.0000 mg | ORAL_TABLET | Freq: Four times a day (QID) | ORAL | Status: DC | PRN

## 2018-04-27 MED ORDER — ZINC OXIDE 40 % EX OINT
1.0000 "application " | TOPICAL_OINTMENT | Freq: Every day | CUTANEOUS | Status: DC | PRN
  Filled 2018-04-27: qty 113

## 2018-04-27 MED ORDER — PROPOFOL 500 MG/50ML IV EMUL
INTRAVENOUS | Status: DC | PRN
  Administered 2018-04-27: 50 ug/kg/min via INTRAVENOUS

## 2018-04-27 MED ORDER — PHENOL 1.4 % MT LIQD
1.0000 | OROMUCOSAL | Status: DC | PRN
  Filled 2018-04-27: qty 177

## 2018-04-27 MED ORDER — GABAPENTIN 300 MG PO CAPS
ORAL_CAPSULE | ORAL | Status: AC
  Administered 2018-04-27: 300 mg
  Filled 2018-04-27: qty 1

## 2018-04-27 MED ORDER — DEXAMETHASONE SODIUM PHOSPHATE 10 MG/ML IJ SOLN
8.0000 mg | Freq: Once | INTRAMUSCULAR | Status: AC
  Administered 2018-04-27: 8 mg via INTRAVENOUS

## 2018-04-27 MED ORDER — SENNOSIDES-DOCUSATE SODIUM 8.6-50 MG PO TABS
1.0000 | ORAL_TABLET | Freq: Two times a day (BID) | ORAL | Status: DC
  Administered 2018-04-27 – 2018-04-29 (×4): 1 via ORAL
  Filled 2018-04-27 (×4): qty 1

## 2018-04-27 MED ORDER — LEVOTHYROXINE SODIUM 25 MCG PO TABS
25.0000 ug | ORAL_TABLET | Freq: Every day | ORAL | Status: DC
  Administered 2018-04-28 – 2018-04-29 (×2): 25 ug via ORAL
  Filled 2018-04-27 (×2): qty 1

## 2018-04-27 MED ORDER — CELECOXIB 200 MG PO CAPS
ORAL_CAPSULE | ORAL | Status: AC
  Filled 2018-04-27: qty 2

## 2018-04-27 MED ORDER — SODIUM CHLORIDE 0.9 % IV SOLN
INTRAVENOUS | Status: DC | PRN
  Administered 2018-04-27: 60 mL

## 2018-04-27 MED ORDER — INSULIN ASPART 100 UNIT/ML ~~LOC~~ SOLN
17.0000 [IU] | Freq: Once | SUBCUTANEOUS | Status: DC
  Filled 2018-04-27: qty 1

## 2018-04-27 MED ORDER — FENTANYL CITRATE (PF) 100 MCG/2ML IJ SOLN
INTRAMUSCULAR | Status: AC
  Filled 2018-04-27: qty 2

## 2018-04-27 MED ORDER — ALUM & MAG HYDROXIDE-SIMETH 200-200-20 MG/5ML PO SUSP: 30.0000 mL | ORAL | Status: DC | PRN

## 2018-04-27 MED ORDER — METOCLOPRAMIDE HCL 5 MG/ML IJ SOLN: 5.0000 mg | Freq: Three times a day (TID) | INTRAMUSCULAR | Status: DC | PRN

## 2018-04-27 MED ORDER — GABAPENTIN 300 MG PO CAPS
300.0000 mg | ORAL_CAPSULE | Freq: Once | ORAL | Status: AC
  Administered 2018-04-27: 300 mg via ORAL

## 2018-04-27 MED ORDER — LIRAGLUTIDE 18 MG/3ML ~~LOC~~ SOPN: 1.8000 mg | PEN_INJECTOR | Freq: Every morning | SUBCUTANEOUS | Status: DC

## 2018-04-27 MED ORDER — POVIDONE-IODINE 7.5 % EX SOLN
Freq: Once | CUTANEOUS | Status: DC
  Filled 2018-04-27: qty 118

## 2018-04-27 MED ORDER — FENTANYL CITRATE (PF) 100 MCG/2ML IJ SOLN
INTRAMUSCULAR | Status: DC | PRN
  Administered 2018-04-27: 50 ug via INTRAVENOUS
  Administered 2018-04-27 (×2): 25 ug via INTRAVENOUS

## 2018-04-27 MED ORDER — DIPHENHYDRAMINE HCL 12.5 MG/5ML PO ELIX: 12.5000 mg | ORAL_SOLUTION | ORAL | Status: DC | PRN

## 2018-04-27 MED ORDER — METOCLOPRAMIDE HCL 10 MG PO TABS: 5.0000 mg | ORAL_TABLET | Freq: Three times a day (TID) | ORAL | Status: DC | PRN

## 2018-04-27 MED ORDER — FAMOTIDINE 20 MG PO TABS
20.0000 mg | ORAL_TABLET | Freq: Once | ORAL | Status: AC
  Administered 2018-04-27: 20 mg via ORAL

## 2018-04-27 MED ORDER — TRAMADOL HCL 50 MG PO TABS: 50.0000 mg | ORAL_TABLET | ORAL | Status: DC | PRN

## 2018-04-27 MED ORDER — ACETAMINOPHEN 10 MG/ML IV SOLN
INTRAVENOUS | Status: DC | PRN
  Administered 2018-04-27: 1000 mg via INTRAVENOUS

## 2018-04-27 MED ORDER — OXYCODONE HCL 5 MG PO TABS
10.0000 mg | ORAL_TABLET | ORAL | Status: DC | PRN
  Administered 2018-04-28 – 2018-04-29 (×3): 10 mg via ORAL
  Filled 2018-04-27 (×4): qty 2

## 2018-04-27 MED ORDER — ADULT MULTIVITAMIN W/MINERALS CH
1.0000 | ORAL_TABLET | Freq: Every day | ORAL | Status: DC
  Administered 2018-04-28 – 2018-04-29 (×2): 1 via ORAL
  Filled 2018-04-27 (×2): qty 1

## 2018-04-27 MED ORDER — TRANEXAMIC ACID 1000 MG/10ML IV SOLN
INTRAVENOUS | Status: DC | PRN
  Administered 2018-04-27: 1000 mg via INTRAVENOUS

## 2018-04-27 MED ORDER — HYDROMORPHONE HCL 1 MG/ML IJ SOLN: 0.5000 mg | INTRAMUSCULAR | Status: DC | PRN

## 2018-04-27 MED ORDER — VITAMIN D 1000 UNITS PO TABS
5000.0000 [IU] | ORAL_TABLET | Freq: Every evening | ORAL | Status: DC
  Administered 2018-04-28: 5000 [IU] via ORAL
  Filled 2018-04-27: qty 5

## 2018-04-27 MED ORDER — FENTANYL CITRATE (PF) 100 MCG/2ML IJ SOLN: 25.0000 ug | INTRAMUSCULAR | Status: DC | PRN

## 2018-04-27 MED ORDER — FLUCONAZOLE 100 MG PO TABS: 200.0000 mg | ORAL_TABLET | ORAL | Status: DC

## 2018-04-27 MED ORDER — CLOTRIMAZOLE 1 % EX CREA
TOPICAL_CREAM | Freq: Two times a day (BID) | CUTANEOUS | Status: DC | PRN
  Filled 2018-04-27: qty 15

## 2018-04-27 MED ORDER — GLYCOPYRROLATE 0.2 MG/ML IJ SOLN
INTRAMUSCULAR | Status: AC
  Filled 2018-04-27: qty 1

## 2018-04-27 MED ORDER — MENTHOL 3 MG MT LOZG
1.0000 | LOZENGE | OROMUCOSAL | Status: DC | PRN
  Filled 2018-04-27: qty 9

## 2018-04-27 MED ORDER — METFORMIN HCL ER 500 MG PO TB24
1000.0000 mg | ORAL_TABLET | Freq: Two times a day (BID) | ORAL | Status: DC
  Administered 2018-04-27 – 2018-04-29 (×4): 1000 mg via ORAL
  Filled 2018-04-27 (×5): qty 2

## 2018-04-27 MED ORDER — B-12 5000 MCG SL SUBL: 5000.0000 ug | SUBLINGUAL_TABLET | Freq: Two times a day (BID) | SUBLINGUAL | Status: DC

## 2018-04-27 MED ORDER — ACETAMINOPHEN 10 MG/ML IV SOLN
1000.0000 mg | Freq: Four times a day (QID) | INTRAVENOUS | Status: AC
  Administered 2018-04-27 – 2018-04-28 (×4): 1000 mg via INTRAVENOUS
  Filled 2018-04-27 (×4): qty 100

## 2018-04-27 MED ORDER — PROPOFOL 10 MG/ML IV BOLUS
INTRAVENOUS | Status: DC | PRN
  Administered 2018-04-27: 15 mg via INTRAVENOUS
  Administered 2018-04-27: 20 mg via INTRAVENOUS
  Administered 2018-04-27: 15 mg via INTRAVENOUS
  Administered 2018-04-27: 30 mg via INTRAVENOUS

## 2018-04-27 MED ORDER — PSEUDOEPHEDRINE HCL 30 MG PO TABS
60.0000 mg | ORAL_TABLET | Freq: Every day | ORAL | Status: DC | PRN
  Filled 2018-04-27: qty 2

## 2018-04-27 MED ORDER — NEOMYCIN-POLYMYXIN B GU 40-200000 IR SOLN
Status: DC | PRN
  Administered 2018-04-27: 14 mL

## 2018-04-27 MED ORDER — FLEET ENEMA 7-19 GM/118ML RE ENEM: 1.0000 | ENEMA | Freq: Once | RECTAL | Status: DC | PRN

## 2018-04-27 MED ORDER — GABAPENTIN 300 MG PO CAPS
300.0000 mg | ORAL_CAPSULE | Freq: Every day | ORAL | Status: DC
  Administered 2018-04-27 – 2018-04-28 (×2): 300 mg via ORAL
  Filled 2018-04-27 (×2): qty 1

## 2018-04-27 MED ORDER — BUPIVACAINE HCL (PF) 0.25 % IJ SOLN
INTRAMUSCULAR | Status: DC | PRN
  Administered 2018-04-27: 60 mL via INTRA_ARTICULAR

## 2018-04-27 MED ORDER — TRANEXAMIC ACID-NACL 1000-0.7 MG/100ML-% IV SOLN
1000.0000 mg | Freq: Once | INTRAVENOUS | Status: AC
  Administered 2018-04-27: 1000 mg via INTRAVENOUS
  Filled 2018-04-27: qty 100

## 2018-04-27 MED ORDER — DEXAMETHASONE SODIUM PHOSPHATE 10 MG/ML IJ SOLN
INTRAMUSCULAR | Status: AC
  Filled 2018-04-27: qty 1

## 2018-04-27 MED ORDER — ONDANSETRON HCL 4 MG PO TABS: 4.0000 mg | ORAL_TABLET | Freq: Four times a day (QID) | ORAL | Status: DC | PRN

## 2018-04-27 MED ORDER — MIDAZOLAM HCL 5 MG/5ML IJ SOLN
INTRAMUSCULAR | Status: DC | PRN
  Administered 2018-04-27 (×2): 2 mg via INTRAVENOUS

## 2018-04-27 MED ORDER — INSULIN ASPART 100 UNIT/ML ~~LOC~~ SOLN
17.0000 [IU] | Freq: Once | SUBCUTANEOUS | Status: AC
  Administered 2018-04-27: 17 [IU] via SUBCUTANEOUS

## 2018-04-27 MED ORDER — LORATADINE 10 MG PO TABS
10.0000 mg | ORAL_TABLET | Freq: Every day | ORAL | Status: DC
  Administered 2018-04-28 – 2018-04-29 (×2): 10 mg via ORAL
  Filled 2018-04-27 (×2): qty 1

## 2018-04-27 MED ORDER — LIDOCAINE HCL (PF) 2 % IJ SOLN
INTRAMUSCULAR | Status: AC
  Filled 2018-04-27: qty 10

## 2018-04-27 MED ORDER — RISAQUAD PO CAPS
1.0000 | ORAL_CAPSULE | Freq: Every day | ORAL | Status: DC
  Administered 2018-04-28 – 2018-04-29 (×2): 1 via ORAL
  Filled 2018-04-27 (×3): qty 1

## 2018-04-27 MED ORDER — FAMOTIDINE 20 MG PO TABS
ORAL_TABLET | ORAL | Status: AC
  Filled 2018-04-27: qty 1

## 2018-04-27 MED ORDER — CELECOXIB 200 MG PO CAPS
200.0000 mg | ORAL_CAPSULE | Freq: Two times a day (BID) | ORAL | Status: DC
  Administered 2018-04-27 – 2018-04-29 (×4): 200 mg via ORAL
  Filled 2018-04-27 (×4): qty 1

## 2018-04-27 MED ORDER — VITAMIN D (ERGOCALCIFEROL) 1.25 MG (50000 UNIT) PO CAPS: 50000.0000 [IU] | ORAL_CAPSULE | ORAL | Status: DC

## 2018-04-27 MED ORDER — PANTOPRAZOLE SODIUM 40 MG PO TBEC
40.0000 mg | DELAYED_RELEASE_TABLET | Freq: Two times a day (BID) | ORAL | Status: DC
  Administered 2018-04-27 – 2018-04-29 (×4): 40 mg via ORAL
  Filled 2018-04-27 (×4): qty 1

## 2018-04-27 MED ORDER — ENOXAPARIN SODIUM 40 MG/0.4ML ~~LOC~~ SOLN
40.0000 mg | Freq: Two times a day (BID) | SUBCUTANEOUS | Status: DC
  Administered 2018-04-28 – 2018-04-29 (×3): 40 mg via SUBCUTANEOUS
  Filled 2018-04-27 (×3): qty 0.4

## 2018-04-27 MED ORDER — ACETAMINOPHEN 10 MG/ML IV SOLN
INTRAVENOUS | Status: AC
  Filled 2018-04-27: qty 100

## 2018-04-27 MED ORDER — INSULIN ASPART 100 UNIT/ML FLEXPEN: 3.0000 [IU] | PEN_INJECTOR | Freq: Three times a day (TID) | SUBCUTANEOUS | Status: DC

## 2018-04-27 MED ORDER — CELECOXIB 200 MG PO CAPS
400.0000 mg | ORAL_CAPSULE | Freq: Once | ORAL | Status: AC
  Administered 2018-04-27: 400 mg via ORAL

## 2018-04-27 MED ORDER — CEFAZOLIN SODIUM-DEXTROSE 2-4 GM/100ML-% IV SOLN
INTRAVENOUS | Status: AC
  Filled 2018-04-27: qty 100

## 2018-04-27 MED ORDER — SODIUM CHLORIDE 0.9 % IV SOLN
INTRAVENOUS | Status: DC
  Administered 2018-04-27 (×2): via INTRAVENOUS

## 2018-04-27 MED ORDER — BUPIVACAINE HCL (PF) 0.5 % IJ SOLN
INTRAMUSCULAR | Status: AC
  Filled 2018-04-27: qty 10

## 2018-04-27 MED ORDER — POLYVINYL ALCOHOL 1.4 % OP SOLN
1.0000 [drp] | Freq: Four times a day (QID) | OPHTHALMIC | Status: DC | PRN
  Filled 2018-04-27: qty 15

## 2018-04-27 MED ORDER — METOCLOPRAMIDE HCL 10 MG PO TABS
10.0000 mg | ORAL_TABLET | Freq: Three times a day (TID) | ORAL | Status: DC
  Administered 2018-04-27 – 2018-04-29 (×6): 10 mg via ORAL
  Filled 2018-04-27 (×6): qty 1

## 2018-04-27 MED ORDER — INSULIN ASPART 100 UNIT/ML ~~LOC~~ SOLN
0.0000 [IU] | Freq: Three times a day (TID) | SUBCUTANEOUS | Status: DC
  Administered 2018-04-27: 5 [IU] via SUBCUTANEOUS
  Administered 2018-04-28 (×3): 8 [IU] via SUBCUTANEOUS
  Filled 2018-04-27 (×4): qty 1

## 2018-04-27 MED ORDER — CALCIUM CARBONATE-VITAMIN D 500-200 MG-UNIT PO TABS
1.0000 | ORAL_TABLET | Freq: Every day | ORAL | Status: DC
  Administered 2018-04-28 – 2018-04-29 (×2): 1 via ORAL
  Filled 2018-04-27 (×2): qty 1

## 2018-04-27 MED ORDER — OXYCODONE HCL 5 MG PO TABS
5.0000 mg | ORAL_TABLET | ORAL | Status: DC | PRN
  Administered 2018-04-27 – 2018-04-28 (×6): 5 mg via ORAL
  Filled 2018-04-27 (×6): qty 1

## 2018-04-27 MED ORDER — BISACODYL 10 MG RE SUPP
10.0000 mg | Freq: Every day | RECTAL | Status: DC | PRN
  Administered 2018-04-29: 10 mg via RECTAL
  Filled 2018-04-27: qty 1

## 2018-04-27 MED ORDER — ONDANSETRON HCL 4 MG/2ML IJ SOLN: 4.0000 mg | Freq: Four times a day (QID) | INTRAMUSCULAR | Status: DC | PRN

## 2018-04-27 MED ORDER — FERROUS SULFATE 325 (65 FE) MG PO TABS
325.0000 mg | ORAL_TABLET | Freq: Two times a day (BID) | ORAL | Status: DC
  Administered 2018-04-28 – 2018-04-29 (×3): 325 mg via ORAL
  Filled 2018-04-27 (×3): qty 1

## 2018-04-27 MED ORDER — POLYETHYL GLYCOL-PROPYL GLYCOL 0.4-0.3 % OP SOLN: 1.0000 [drp] | Freq: Four times a day (QID) | OPHTHALMIC | Status: DC | PRN

## 2018-04-27 MED ORDER — ONDANSETRON HCL 4 MG/2ML IJ SOLN: 4.0000 mg | Freq: Once | INTRAMUSCULAR | Status: DC | PRN

## 2018-04-27 MED ORDER — SODIUM CHLORIDE 0.9 % IV SOLN
INTRAVENOUS | Status: DC
  Administered 2018-04-27: 17:00:00 via INTRAVENOUS

## 2018-04-27 MED ORDER — NIACIN ER 500 MG PO TBCR: 250.0000 mg | EXTENDED_RELEASE_TABLET | Freq: Every day | ORAL | Status: DC

## 2018-04-27 SURGICAL SUPPLY — 74 items
ATTUNE PSFEM LTSZ6 NARCEM KNEE (Femur) ×3 IMPLANT
ATTUNE PSRP INSR SZ6 5 KNEE (Insert) ×2 IMPLANT
ATTUNE PSRP INSR SZ6 5MM KNEE (Insert) ×1 IMPLANT
BASE TIBIA ATTUNE KNEE SYS SZ6 (Knees) ×1 IMPLANT
BATTERY INSTRU NAVIGATION (MISCELLANEOUS) ×12 IMPLANT
BLADE SAW 70X12.5 (BLADE) ×3 IMPLANT
BLADE SAW 90X13X1.19 OSCILLAT (BLADE) ×3 IMPLANT
BLADE SAW 90X25X1.19 OSCILLAT (BLADE) ×3 IMPLANT
BONE CEMENT GENTAMICIN (Cement) ×6 IMPLANT
CANISTER SUCT 1200ML W/VALVE (MISCELLANEOUS) ×3 IMPLANT
CANISTER SUCT 3000ML PPV (MISCELLANEOUS) ×6 IMPLANT
CEMENT BONE GENTAMICIN 40 (Cement) ×2 IMPLANT
COOLER POLAR GLACIER W/PUMP (MISCELLANEOUS) ×3 IMPLANT
COVER WAND RF STERILE (DRAPES) ×3 IMPLANT
CUFF TOURN 24 STER (MISCELLANEOUS) IMPLANT
CUFF TOURN 30 STER DUAL PORT (MISCELLANEOUS) IMPLANT
DRAPE SHEET LG 3/4 BI-LAMINATE (DRAPES) ×3 IMPLANT
DRSG DERMACEA 8X12 NADH (GAUZE/BANDAGES/DRESSINGS) ×3 IMPLANT
DRSG OPSITE POSTOP 4X14 (GAUZE/BANDAGES/DRESSINGS) ×3 IMPLANT
DRSG TEGADERM 4X4.75 (GAUZE/BANDAGES/DRESSINGS) ×3 IMPLANT
DURAPREP 26ML APPLICATOR (WOUND CARE) ×6 IMPLANT
ELECT CAUTERY BLADE 6.4 (BLADE) ×3 IMPLANT
ELECT REM PT RETURN 9FT ADLT (ELECTROSURGICAL) ×3
ELECTRODE REM PT RTRN 9FT ADLT (ELECTROSURGICAL) ×1 IMPLANT
EX-PIN ORTHOLOCK NAV 4X150 (PIN) ×6 IMPLANT
GLOVE BIOGEL M STRL SZ7.5 (GLOVE) ×6 IMPLANT
GLOVE BIOGEL PI IND STRL 9 (GLOVE) ×2 IMPLANT
GLOVE BIOGEL PI INDICATOR 9 (GLOVE) ×4
GLOVE INDICATOR 8.0 STRL GRN (GLOVE) ×9 IMPLANT
GLOVE SURG SYN 9.0  PF PI (GLOVE) ×2
GLOVE SURG SYN 9.0 PF PI (GLOVE) ×1 IMPLANT
GOWN STRL REUS W/ TWL LRG LVL3 (GOWN DISPOSABLE) ×3 IMPLANT
GOWN STRL REUS W/TWL 2XL LVL3 (GOWN DISPOSABLE) ×3 IMPLANT
GOWN STRL REUS W/TWL LRG LVL3 (GOWN DISPOSABLE) ×6
HEMOVAC 400CC 10FR (MISCELLANEOUS) ×3 IMPLANT
HOLDER FOLEY CATH W/STRAP (MISCELLANEOUS) ×3 IMPLANT
HOOD PEEL AWAY FLYTE STAYCOOL (MISCELLANEOUS) ×9 IMPLANT
KIT TURNOVER KIT A (KITS) ×3 IMPLANT
KNIFE SCULPS 14X20 (INSTRUMENTS) ×3 IMPLANT
LABEL OR SOLS (LABEL) ×3 IMPLANT
NDL SAFETY ECLIPSE 18X1.5 (NEEDLE) ×1 IMPLANT
NEEDLE HYPO 18GX1.5 SHARP (NEEDLE) ×2
NEEDLE SPNL 20GX3.5 QUINCKE YW (NEEDLE) ×6 IMPLANT
NS IRRIG 500ML POUR BTL (IV SOLUTION) ×3 IMPLANT
PACK TOTAL KNEE (MISCELLANEOUS) ×3 IMPLANT
PAD WRAPON POLAR KNEE (MISCELLANEOUS) IMPLANT
PAD WRAPON POLOR MULTI XL (MISCELLANEOUS) ×1 IMPLANT
PATELLA MEDIAL ATTUN 35MM KNEE (Knees) ×3 IMPLANT
PIN DRILL QUICK PACK ×3 IMPLANT
PIN FIXATION 1/8DIA X 3INL (PIN) ×9 IMPLANT
PULSAVAC PLUS IRRIG FAN TIP (DISPOSABLE) ×3
SOL .9 NS 3000ML IRR  AL (IV SOLUTION) ×2
SOL .9 NS 3000ML IRR UROMATIC (IV SOLUTION) ×1 IMPLANT
SOL PREP PVP 2OZ (MISCELLANEOUS) ×3
SOLUTION PREP PVP 2OZ (MISCELLANEOUS) ×1 IMPLANT
SPONGE DRAIN TRACH 4X4 STRL 2S (GAUZE/BANDAGES/DRESSINGS) ×3 IMPLANT
STAPLER SKIN PROX 35W (STAPLE) ×3 IMPLANT
STOCKINETTE IMPERV 14X48 (MISCELLANEOUS) ×3 IMPLANT
STRAP TIBIA SHORT (MISCELLANEOUS) ×3 IMPLANT
SUCTION FRAZIER HANDLE 10FR (MISCELLANEOUS) ×2
SUCTION TUBE FRAZIER 10FR DISP (MISCELLANEOUS) ×1 IMPLANT
SUT VIC AB 0 CT1 36 (SUTURE) ×6 IMPLANT
SUT VIC AB 1 CT1 36 (SUTURE) ×6 IMPLANT
SUT VIC AB 2-0 CT2 27 (SUTURE) ×3 IMPLANT
SYR 20CC LL (SYRINGE) ×3 IMPLANT
SYR 30ML LL (SYRINGE) ×6 IMPLANT
TIBIA ATTUNE KNEE SYS BASE SZ6 (Knees) ×3 IMPLANT
TIP FAN IRRIG PULSAVAC PLUS (DISPOSABLE) ×1 IMPLANT
TOWEL OR 17X26 4PK STRL BLUE (TOWEL DISPOSABLE) ×3 IMPLANT
TOWER CARTRIDGE SMART MIX (DISPOSABLE) ×3 IMPLANT
TRAY FOLEY MTR SLVR 16FR STAT (SET/KITS/TRAYS/PACK) ×3 IMPLANT
WRAP-ON POLOR PAD MULTI XL (MISCELLANEOUS) ×1
WRAPON POLAR PAD KNEE (MISCELLANEOUS)
WRAPON POLOR PAD MULTI XL (MISCELLANEOUS) ×2

## 2018-04-27 NOTE — Progress Notes (Signed)
Pt states she takes  1 unit of novolog insulin for every 10 points over 150 units. Dr Roland Rack notified for insulin order. Received insulin order to dose as pt does at home.

## 2018-04-27 NOTE — Anesthesia Preprocedure Evaluation (Signed)
Anesthesia Evaluation  Patient identified by MRN, date of birth, ID band Patient awake    Reviewed: Allergy & Precautions, H&P , NPO status , Patient's Chart, lab work & pertinent test results, reviewed documented beta blocker date and time   Airway Mallampati: II   Neck ROM: full    Dental  (+) Poor Dentition   Pulmonary neg pulmonary ROS, asthma ,    Pulmonary exam normal        Cardiovascular + Past MI  negative cardio ROS Normal cardiovascular exam Rhythm:regular Rate:Normal     Neuro/Psych PSYCHIATRIC DISORDERS Depression negative neurological ROS  negative psych ROS   GI/Hepatic negative GI ROS, Neg liver ROS,   Endo/Other  negative endocrine ROSdiabetes, Well Controlled, Type 2, Oral Hypoglycemic AgentsHypothyroidism   Renal/GU Renal diseasenegative Renal ROS  negative genitourinary   Musculoskeletal   Abdominal   Peds  Hematology negative hematology ROS (+)   Anesthesia Other Findings Past Medical History: No date: Abnormal uterine bleeding No date: Arthritis No date: Asthma No date: Depression No date: Diabetes mellitus without complication (HCC) No date: Eczema No date: History of kidney stones No date: Hyperlipidemia No date: Hypothyroidism No date: Kidney stones No date: Morbid obesity with BMI of 50.0-59.9, adult (HCC) No date: Myocardial infarction ([place]) No date: Rosacea No date: Urinary incontinence, mixed Past Surgical History: No date: CESAREAN SECTION No date: COLONOSCOPY 12/01/2017: COLONOSCOPY WITH PROPOFOL; N/A     Comment:  Procedure: COLONOSCOPY WITH PROPOFOL;  Surgeon: Manya Silvas, MD;  Location: [facility] ENDOSCOPY;  Service:               Endoscopy;  Laterality: N/A; No date: Excision left foot No date: LITHOTRIPSY BMI    Body Mass Index:  53.26 kg/m     Reproductive/Obstetrics negative OB ROS                             Anesthesia  Physical Anesthesia Plan  ASA: III  Anesthesia Plan: General and Spinal   Post-op Pain Management:    Induction:   PONV Risk Score and Plan:   Airway Management Planned:   Additional Equipment:   Intra-op Plan:   Post-operative Plan:   Informed Consent: I have reviewed the patients History and Physical, chart, labs and discussed the procedure including the risks, benefits and alternatives for the proposed anesthesia with the patient or authorized representative who has indicated his/her understanding and acceptance.   Dental Advisory Given  Plan Discussed with: CRNA  Anesthesia Plan Comments:         Anesthesia Quick Evaluation

## 2018-04-27 NOTE — NC FL2 (Signed)
[place] LEVEL OF CARE SCREENING TOOL     IDENTIFICATION  Patient Name: Stephanie Mathews Birthdate: 1960-08-23 Sex: female Admission Date (Current Location): 04/27/2018  [place] and [place] Number:  Engineering geologist and Address:  [facility], [address] [place], [place] [address]      Provider Number: [id number]  Attending Physician Name and Address:  Dereck Leep, MD  Relative Name and Phone Number:       Current Level of Care: Hospital Recommended Level of Care: [place] Prior Approval Number:    Date Approved/Denied:   PASRR Number: [id number])  Discharge Plan: SNF    Current Diagnoses: Patient Active Problem List   Diagnosis Date Noted  . S/P total knee arthroplasty 04/27/2018  . Venous stasis dermatitis of both lower extremities 01/13/2018  . Lower limb ulcer, calf ([place]) 01/13/2018  . Asthma without status asthmaticus 01/11/2018  . Depression 01/11/2018  . Eczema 01/11/2018  . Hyperlipidemia 01/11/2018  . Kidney stones 01/11/2018  . Morbid obesity with body mass index (BMI) of 50.0 to 59.9 in adult ([place]) 01/11/2018  . Diabetes mellitus type 2, uncomplicated ([place]) 35/00/9381  . Hx of adenomatous colonic polyps 09/12/2017  . Primary osteoarthritis of left knee 03/18/2016  . Breast calcification, left 03/16/2015  . Vitamin D deficiency 01/24/2014    Orientation RESPIRATION BLADDER Height & Weight     Self, Time, Situation, Place  Normal Continent Weight: 272 lb 11.3 oz (123.7 kg) Height:  5' (152.4 cm)  BEHAVIORAL SYMPTOMS/MOOD NEUROLOGICAL BOWEL NUTRITION STATUS      Continent Diet(Diet: Regular )  AMBULATORY STATUS COMMUNICATION OF NEEDS Skin   Extensive Assist Verbally Surgical wounds(Incision: Left Knee. )                       Personal Care Assistance Level of Assistance  Bathing, Feeding, Dressing Bathing Assistance: Limited assistance Feeding assistance:  Independent Dressing Assistance: Limited assistance     Functional Limitations Info  Sight, Hearing, Speech Sight Info: Adequate Hearing Info: Adequate Speech Info: Adequate    SPECIAL CARE FACTORS FREQUENCY  PT (By licensed PT), OT (By licensed OT)     PT Frequency: (5) OT Frequency: (5)            Contractures      Additional Factors Info  Code Status, Allergies Code Status Info: (Full Code. ) Allergies Info: (Chlorhexidine, Actos Pioglitazone, Adhesive Tape, Ibuprofen, Januvia Sitagliptin, Lisinopril, Statins, Vicodin Hydrocodone- acetaminophen, Byetta 10 Mcg Pen Exenatide, Olive Oil, Peanut Oil)           Current Medications (04/27/2018):  This is the current hospital active medication list Current Facility-Administered Medications  Medication Dose Route Frequency Provider Last Rate Last Dose  . 0.9 %  sodium chloride infusion   Intravenous Continuous Hooten, Laurice Record, MD      . acetaminophen (OFIRMEV) IV 1,000 mg  1,000 mg Intravenous Q6H Hooten, Laurice Record, MD      . Derrill Memo ON 04/28/2018] acetaminophen (TYLENOL) tablet 325-650 mg  325-650 mg Oral Q6H PRN Hooten, Laurice Record, MD      . alum & mag hydroxide-simeth (MAALOX/MYLANTA) 200-200-20 MG/5ML suspension 30 mL  30 mL Oral Q4H PRN Hooten, Laurice Record, MD      . B-12 SUBL 5,000 mcg  5,000 mcg Sublingual BID Hooten, Laurice Record, MD      . bisacodyl (DULCOLAX) suppository 10 mg  10 mg Rectal Daily PRN Hooten, Laurice Record, MD      .  calcium-vitamin D (OSCAL WITH D) 500-200 MG-UNIT per tablet 1 tablet  1 tablet Oral Daily Hooten, Laurice Record, MD      . ceFAZolin (ANCEF) 3 g in dextrose 5 % 50 mL IVPB  3 g Intravenous Q6H Hooten, Laurice Record, MD      . celecoxib (CELEBREX) 200 MG capsule           . celecoxib (CELEBREX) capsule 200 mg  200 mg Oral BID Hooten, Laurice Record, MD      . cholecalciferol (VITAMIN D) tablet 5,000 Units  5,000 Units Oral QPM Hooten, Laurice Record, MD      . clotrimazole (LOTRIMIN) 1 % cream   Topical BID PRN Hooten, Laurice Record, MD       . dexamethasone (DECADRON) 10 MG/ML injection           . DIGESTIVE ADVANTAGE CAPS 1 capsule  1 capsule Oral Daily Hooten, Laurice Record, MD      . diphenhydrAMINE (BENADRYL) 12.5 MG/5ML elixir 12.5-25 mg  12.5-25 mg Oral Q4H PRN Hooten, Laurice Record, MD      . Derrill Memo ON 04/28/2018] enoxaparin (LOVENOX) injection 40 mg  40 mg Subcutaneous Q12H Hooten, Laurice Record, MD      . famotidine (PEPCID) 20 MG tablet           . ferrous sulfate tablet 325 mg  325 mg Oral BID WC Hooten, Laurice Record, MD      . Derrill Memo ON 05/03/2018] fluconazole (DIFLUCAN) tablet 200 mg  200 mg Oral Q Sun Hooten, Laurice Record, MD      . gabapentin (NEURONTIN) 300 MG capsule           . gabapentin (NEURONTIN) capsule 300 mg  300 mg Oral QHS Hooten, Laurice Record, MD      . HYDROmorphone (DILAUDID) injection 0.5-1 mg  0.5-1 mg Intravenous Q4H PRN Hooten, Laurice Record, MD      . insulin aspart (novoLOG) injection 0-15 Units  0-15 Units Subcutaneous TID WC Hooten, Laurice Record, MD      . Derrill Memo ON 04/28/2018] levothyroxine (SYNTHROID, LEVOTHROID) tablet 25 mcg  25 mcg Oral QAC breakfast Hooten, Laurice Record, MD      . Derrill Memo ON 04/28/2018] liraglutide (VICTOZA) SOPN 1.8 mg  1.8 mg Subcutaneous q morning - 10a Hooten, Laurice Record, MD      . Derrill Memo ON 04/28/2018] loratadine (CLARITIN) tablet 10 mg  10 mg Oral Daily Hooten, Laurice Record, MD      . losartan (COZAAR) tablet 12.5 mg  12.5 mg Oral Daily Hooten, Laurice Record, MD      . magnesium hydroxide (MILK OF MAGNESIA) suspension 30 mL  30 mL Oral Daily Hooten, Laurice Record, MD      . menthol-cetylpyridinium (CEPACOL) lozenge 3 mg  1 lozenge Oral PRN Hooten, Laurice Record, MD       Or  . phenol (CHLORASEPTIC) mouth spray 1 spray  1 spray Mouth/Throat PRN Hooten, Laurice Record, MD      . metFORMIN (GLUCOPHAGE-XR) 24 hr tablet 1,000 mg  1,000 mg Oral BID WC Hooten, Laurice Record, MD      . metoCLOPramide (REGLAN) tablet 5-10 mg  5-10 mg Oral Q8H PRN Hooten, Laurice Record, MD       Or  . metoCLOPramide (REGLAN) injection 5-10 mg  5-10 mg Intravenous Q8H PRN Hooten,  Laurice Record, MD      . metoCLOPramide (REGLAN) tablet 10 mg  10 mg Oral TID AC & HS Hooten, Laurice Record, MD      .  multivitamin with minerals tablet 1 tablet  1 tablet Oral Daily Hooten, Laurice Record, MD      . Derrill Memo ON 04/28/2018] niacin (SLO-NIACIN) CR tablet 500 mg  500 mg Oral Q lunch Hooten, Laurice Record, MD      . ondansetron (ZOFRAN) tablet 4 mg  4 mg Oral Q6H PRN Hooten, Laurice Record, MD       Or  . ondansetron (ZOFRAN) injection 4 mg  4 mg Intravenous Q6H PRN Hooten, Laurice Record, MD      . oxyCODONE (Oxy IR/ROXICODONE) immediate release tablet 10 mg  10 mg Oral Q4H PRN Hooten, Laurice Record, MD      . oxyCODONE (Oxy IR/ROXICODONE) immediate release tablet 5 mg  5 mg Oral Q4H PRN Hooten, Laurice Record, MD      . pantoprazole (PROTONIX) EC tablet 40 mg  40 mg Oral BID Hooten, Laurice Record, MD      . Polyethyl Glycol-Propyl Glycol 0.4-0.3 % SOLN 1 drop  1 drop Both Eyes QID PRN Hooten, Laurice Record, MD      . polyvinyl alcohol (LIQUIFILM TEARS) 1.4 % ophthalmic solution 1 drop  1 drop Both Eyes QID PRN Hooten, Laurice Record, MD      . pseudoephedrine (SUDAFED) tablet 60 mg  60 mg Oral Daily PRN Hooten, Laurice Record, MD      . senna-docusate (Senokot-S) tablet 1 tablet  1 tablet Oral BID Hooten, Laurice Record, MD      . sodium phosphate (FLEET) 7-19 GM/118ML enema 1 enema  1 enema Rectal Once PRN Hooten, Laurice Record, MD      . traMADol (ULTRAM) tablet 50-100 mg  50-100 mg Oral Q4H PRN Hooten, Laurice Record, MD      . Vitamin D (Ergocalciferol) (DRISDOL) capsule 50,000 Units  50,000 Units Oral Q14 Days Hooten, Laurice Record, MD      . Zinc Oxide 40 % PSTE   Topical Daily PRN Hooten, Laurice Record, MD         Discharge Medications: Please see discharge summary for a list of discharge medications.  Relevant Imaging Results:  Relevant Lab Results:   Additional Information (SSN: [id number])  Serenidy Waltz, Veronia Beets, LCSW

## 2018-04-27 NOTE — Anesthesia Post-op Follow-up Note (Signed)
Anesthesia QCDR form completed.        

## 2018-04-27 NOTE — Transfer of Care (Signed)
Immediate Anesthesia Transfer of Care Note  Patient: Stephanie Mathews  Procedure(s) Performed: COMPUTER ASSISTED TOTAL KNEE ARTHROPLASTY (Left )  Patient Location: PACU  Anesthesia Type:Spinal  Level of Consciousness: awake, alert , oriented and patient cooperative  Airway & Oxygen Therapy: Patient Spontanous Breathing and Patient connected to nasal cannula oxygen  Post-op Assessment: Report given to RN and Post -op Vital signs reviewed and stable  Post vital signs: Reviewed and stable  Last Vitals:  Vitals Value Taken Time  BP    Temp    Pulse    Resp    SpO2      Last Pain:  Vitals:   04/27/18 1008  TempSrc: Oral  PainSc: 9       Patients Stated Pain Goal: 4 (01/12/15 0109)  Complications: No apparent anesthesia complications

## 2018-04-27 NOTE — H&P (Signed)
The patient has been re-examined, and the chart reviewed, and there have been no interval changes to the documented history and physical.    The risks, benefits, and alternatives have been discussed at length. The patient expressed understanding of the risks benefits and agreed with plans for surgical intervention.  Kaliegh Willadsen P. Jessie Cowher, Jr. M.D.    

## 2018-04-27 NOTE — Op Note (Signed)
OPERATIVE NOTE  DATE OF SURGERY:  04/27/2018  PATIENT NAME:  Stephanie Mathews   DOB: 1961-06-08  MRN: [id number]  PRE-OPERATIVE DIAGNOSIS: Degenerative arthrosis of the left knee, primary  POST-OPERATIVE DIAGNOSIS:  Same  PROCEDURE:  Left total knee arthroplasty using computer-assisted navigation  SURGEON:  Marciano Sequin. M.D.  ASSISTANT:  Vance Peper, PA (present and scrubbed throughout the case, critical for assistance with exposure, retraction, instrumentation, and closure)  ANESTHESIA: spinal  ESTIMATED BLOOD LOSS: 50 mL  FLUIDS REPLACED: 1100 mL of crystalloid  TOURNIQUET TIME: 87 minutes  DRAINS: 2 medium Hemovac drains  SOFT TISSUE RELEASES: Anterior cruciate ligament, posterior cruciate ligament, deep and superficial medial collateral ligament, patellofemoral ligament  IMPLANTS UTILIZED: DePuy Attune size 6N posterior stabilized femoral component (cemented), size 6 rotating platform tibial component (cemented), 35 mm medialized dome patella (cemented), and a 5 mm stabilized rotating platform polyethylene insert.  INDICATIONS FOR SURGERY: Stephanie Mathews is a 57 y.o. year old female with a long history of progressive knee pain. X-rays demonstrated severe degenerative changes in tricompartmental fashion. The patient had not seen any significant improvement despite conservative nonsurgical intervention. After discussion of the risks and benefits of surgical intervention, the patient expressed understanding of the risks benefits and agree with plans for total knee arthroplasty.   The risks, benefits, and alternatives were discussed at length including but not limited to the risks of infection, bleeding, nerve injury, stiffness, blood clots, the need for revision surgery, cardiopulmonary complications, among others, and they were willing to proceed.  PROCEDURE IN DETAIL: The patient was brought into the operating room and, after adequate spinal anesthesia was achieved,  a tourniquet was placed on the patient's upper thigh. The patient's knee and leg were cleaned and prepped with alcohol and DuraPrep and draped in the usual sterile fashion. A "timeout" was performed as per usual protocol. The lower extremity was exsanguinated using an Esmarch, and the tourniquet was inflated to 300 mmHg. An anterior longitudinal incision was made followed by a standard mid vastus approach. The deep fibers of the medial collateral ligament were elevated in a subperiosteal fashion off of the medial flare of the tibia so as to maintain a continuous soft tissue sleeve. The patella was subluxed laterally and the patellofemoral ligament was incised. Inspection of the knee demonstrated severe degenerative changes with full-thickness loss of articular cartilage. Osteophytes were debrided using a rongeur. Anterior and posterior cruciate ligaments were excised. Two 4.0 mm Schanz pins were inserted in the femur and into the tibia for attachment of the array of trackers used for computer-assisted navigation. Hip center was identified using a circumduction technique. Distal landmarks were mapped using the computer. The distal femur and proximal tibia were mapped using the computer. The distal femoral cutting guide was positioned using computer-assisted navigation so as to achieve a 5 distal valgus cut. The femur was sized and it was felt that a size 6N femoral component was appropriate. A size 6 femoral cutting guide was positioned and the anterior cut was performed and verified using the computer. This was followed by completion of the posterior and chamfer cuts. Femoral cutting guide for the central box was then positioned in the center box cut was performed.  Attention was then directed to the proximal tibia. Medial and lateral menisci were excised. The extramedullary tibial cutting guide was positioned using computer-assisted navigation so as to achieve a 0 varus-valgus alignment and 3 posterior slope.  The cut was performed and verified using the computer. The  proximal tibia was sized and it was felt that a size 6 tibial tray was appropriate. Tibial and femoral trials were inserted followed by insertion of a 5 mm polyethylene insert. The knee was felt to be tight medially. A Cobb elevator was used to elevate the superficial fibers of the medial collateral ligament.  This allowed for excellent mediolateral soft tissue balancing both in flexion and in full extension. Finally, the patella was cut and prepared so as to accommodate a 35 mm medialized dome patella. A patella trial was placed and the knee was placed through a range of motion with excellent patellar tracking appreciated. The femoral trial was removed after debridement of posterior osteophytes. The central post-hole for the tibial component was reamed followed by insertion of a keel punch. Tibial trials were then removed. Cut surfaces of bone were irrigated with copious amounts of normal saline with antibiotic solution using pulsatile lavage and then suctioned dry. Polymethylmethacrylate cement with gentamicin was prepared in the usual fashion using a vacuum mixer. Cement was applied to the cut surface of the proximal tibia as well as along the undersurface of a size 6 rotating platform tibial component. Tibial component was positioned and impacted into place. Excess cement was removed using [employer] fast streamer. Cement was then applied to the cut surfaces of the femur as well as along the posterior flanges of the size 6N femoral component. The femoral component was positioned and impacted into place. Excess cement was removed using [employer] fast streamer. A 5 mm polyethylene trial was inserted and the knee was brought into full extension with steady axial compression applied. Finally, cement was applied to the backside of a 35 mm medialized dome patella and the patellar component was positioned and patellar clamp applied. Excess cement was removed using Air cabin crew. After adequate curing of the cement, the tourniquet was deflated after a total tourniquet time of 87 minutes. Hemostasis was achieved using electrocautery. The knee was irrigated with copious amounts of normal saline with antibiotic solution using pulsatile lavage and then suctioned dry. 20 mL of 1.3% Exparel and 60 mL of 0.25% Marcaine in 40 mL of normal saline was injected along the posterior capsule, medial and lateral gutters, and along the arthrotomy site. A 5 mm stabilized rotating platform polyethylene insert was inserted and the knee was placed through a range of motion with excellent mediolateral soft tissue balancing appreciated and excellent patellar tracking noted. 2 medium drains were placed in the wound bed and brought out through separate stab incisions. The medial parapatellar portion of the incision was reapproximated using interrupted sutures of #1 Vicryl. Subcutaneous tissue was approximated in layers using first #0 Vicryl followed #2-0 Vicryl. The skin was approximated with skin staples. A sterile dressing was applied.  The patient tolerated the procedure well and was transported to the recovery room in stable condition.    James P. Holley Bouche., M.D.

## 2018-04-27 NOTE — Progress Notes (Signed)
Anticoagulation monitoring(Lovenox):  57 yo female ordered Lovenox 30 mg Q12h  Filed Weights   04/27/18 1008  Weight: 272 lb 11.3 oz (123.7 kg)   BMI 53.26   Lab Results  Component Value Date   CREATININE 0.98 04/14/2018   CREATININE 1.09 (H) 12/31/2017   Estimated Creatinine Clearance: 77.7 mL/min (by C-G formula based on SCr of 0.98 mg/dL). Hemoglobin & Hematocrit     Component Value Date/Time   HGB 16.4 (H) 04/14/2018 1236   HCT 47.3 (H) 04/14/2018 1236     Per Protocol for Patient with estCrcl > 30 ml/min and BMI > 40, will transition to Lovenox 40 mg Q12h.

## 2018-04-27 NOTE — Progress Notes (Signed)
Patient is being admitted to room 140 from OR. A&O x4. Foley, hemovac, polar care, and post-op dressing in place. Iv fluids started. Bone foam placed. Orders reviewed. Room full of family.

## 2018-04-27 NOTE — Anesthesia Procedure Notes (Signed)
Spinal  Patient location during procedure: OR Start time: 04/27/2018 11:30 AM End time: 04/27/2018 11:31 AM Staffing Resident/CRNA: Bernardo Heater, CRNA Performed: resident/CRNA  Preanesthetic Checklist Completed: patient identified, site marked, surgical consent, pre-op evaluation, timeout performed, IV checked, risks and benefits discussed and monitors and equipment checked Spinal Block Patient position: sitting Prep: Betadine Patient monitoring: heart rate, continuous pulse ox, blood pressure and cardiac monitor Approach: midline Location: L3-4 Injection technique: single-shot Needle Needle type: Whitacre and Introducer  Needle gauge: 22 G Needle length: 12.7 cm Additional Notes Negative paresthesia. Negative blood return. Positive free-flowing CSF. Expiration date of kit checked and confirmed. Patient tolerated procedure well, without complications.

## 2018-04-27 NOTE — Progress Notes (Signed)
CBG 326. Dr Roland Rack notified. New order received

## 2018-04-28 ENCOUNTER — Encounter: Payer: Self-pay | Admitting: Orthopedic Surgery

## 2018-04-28 LAB — GLUCOSE, CAPILLARY
GLUCOSE-CAPILLARY: 298 mg/dL — AB (ref 70–99)
Glucose-Capillary: 291 mg/dL — ABNORMAL HIGH (ref 70–99)
Glucose-Capillary: 299 mg/dL — ABNORMAL HIGH (ref 70–99)
Glucose-Capillary: 334 mg/dL — ABNORMAL HIGH (ref 70–99)

## 2018-04-28 MED ORDER — OXYCODONE HCL 5 MG PO TABS: 5.0000 mg | ORAL_TABLET | ORAL | 0 refills | Status: DC | PRN

## 2018-04-28 MED ORDER — ENOXAPARIN SODIUM 40 MG/0.4ML ~~LOC~~ SOLN: 40.0000 mg | SUBCUTANEOUS | 0 refills | Status: DC

## 2018-04-28 MED ORDER — TRAMADOL HCL 50 MG PO TABS: 50.0000 mg | ORAL_TABLET | ORAL | 0 refills | Status: DC | PRN

## 2018-04-28 NOTE — Progress Notes (Signed)
Pt tolerated sitting on side of bed 

## 2018-04-28 NOTE — Evaluation (Signed)
Occupational Therapy Evaluation Patient Details Name: Stephanie Mathews MRN: [id number] DOB: 1961-06-28 Today's Date: 04/28/2018    History of Present Illness Pt is a 57 y/o F s/p L TKA.  Pt's PMH includes morbid obesity.   Clinical Impression   Pt seen for OT evaluation this date. Pt is 57 y/o female who presented with a L TKA. Prior to admission, pt enjoyed cooking for family and friends. Pt lives with her spouse and two adult children in a one level house with one step entry. Currently, pt demonstrates impairments in pain, ROM, strength and and balance requiring MIN A  and AE for LB dressing and bathing to increase her independence and safety. Pt was orthostatic but asymptomatic during functional mobility. In supine BP 144/69, Sitting EOB 139/75, standing 121/53. Able to get from bed to recliner. Denied lightheadedness throughout session. Pt/sister educated in polar care and compression sock mgt including donning/doffing, wear schedule and positioning as well as LB dressing techniques. Pt/sister also educated in falls risk prevention strategies. Education provided to maximize safety and independence and minimize falls risk and caregiver burden. Pt/sister verbalized understanding of all education presented this date. No further skilled OT sessions are required at this time. OT recommends pt going home upon discharge.    Follow Up Recommendations  No OT follow up    Equipment Recommendations  Tub/shower bench;Other (comment)(reacher)    Recommendations for Other Services       Precautions / Restrictions Precautions Precautions: Knee Precaution Booklet Issued: No Precaution Comments:  Required Braces or Orthoses: Knee Immobilizer - Left Knee Immobilizer - Left: Other (comment)("if unable to perform SLR") Restrictions Weight Bearing Restrictions: Yes LLE Weight Bearing: Weight bearing as tolerated      Mobility Bed Mobility Overal bed mobility: Needs Assistance Bed Mobility:  Supine to Sit     Supine to sit: Supervision Sit to supine: Supervision     Transfers Overall transfer level: Needs assistance Equipment used: Rolling walker (2 wheeled) Transfers: Sit to/from Stand Sit to Stand: Min guard            Balance Overall balance assessment: Needs assistance Sitting-balance support: Feet supported;No upper extremity supported Sitting balance-Leahy Scale: Good     Standing balance support: Bilateral upper extremity supported;During functional activity Standing balance-Leahy Scale: Poor Standing balance comment: Relies on at least 1UE support for static activities                           ADL either performed or assessed with clinical judgement   ADL Overall ADL's : Needs assistance/impaired Eating/Feeding: Independent   Grooming: Sitting;Modified independent   Upper Body Bathing: Sitting;Supervision/ safety   Lower Body Bathing: Moderate assistance;Sitting/lateral leans;With adaptive equipment   Upper Body Dressing : Supervision/safety;Sitting   Lower Body Dressing: Sitting/lateral leans;With adaptive equipment;Moderate assistance   Toilet Transfer: Min guard;RW           Functional mobility during ADLs: Min guard;Rolling walker       Vision Patient Visual Report: No change from baseline       Perception     Praxis      Pertinent Vitals/Pain Pain Assessment: 0-10 Pain Score: 4  Pain Location: L knee Pain Descriptors / Indicators: Aching Pain Intervention(s): Limited activity within patient's tolerance;Ice applied;Repositioned;Premedicated before session     Hand Dominance     Extremity/Trunk Assessment Upper Extremity Assessment Upper Extremity Assessment: Overall WFL for tasks assessed   Lower Extremity Assessment Lower Extremity Assessment:  LLE deficits/detail LLE Deficits / Details: Expected LLE strength and ROM deficits       Communication Communication Communication: No difficulties    Cognition Arousal/Alertness: Awake/alert Behavior During Therapy: WFL for tasks assessed/performed Overall Cognitive Status: Within Functional Limits for tasks assessed                                     General Comments  Pt was orthostatic but asymptomatic during functional mobility. In supine BP 144/69, Sitting EOB 139/75, standing 121/53. Able to get from bed to recliner. Denied lightheadedness throughout session.     Exercises  Other Exercises Other Exercises: Discussed safety in her home, proper footwear, home layout, decluttering, etc.  Other Exercises: Pt educated in polar care and compression sock mgt including donning/doffing, wear schedule and positioning. Other Exercises: Pt educated in falls risk prevention strategies to maximize safety. Other Exercises: Pt educated in LB dressing techniques using approriate AE to maximize independence and decrease caregiver burden.    Shoulder Instructions      Home Living Family/patient expects to be discharged to:: Private residence Living Arrangements: Spouse/significant other;Children Available Help at Discharge: Family;Available 24 hours/day Type of Home: House Home Access: Stairs to enter [employer] of Steps: 1 Entrance Stairs-Rails: Left Home Layout: One level     Bathroom Shower/Tub: Tub/shower unit         Home Equipment: Cane - single point          Prior Functioning/Environment Level of Independence: Independent with assistive device(s)        Comments: Pt ambulating community distances with SPC.  Pt reports frequent stumbling due to L knee pain/instability but no falls in the past 6 months.         OT Problem List: Decreased strength;Pain;Decreased range of motion;Decreased activity tolerance;Decreased knowledge of use of DME or AE;Decreased safety awareness;Impaired balance (sitting and/or standing)      OT Treatment/Interventions:      OT Goals(Current goals can be found in  the care plan section) Acute Rehab OT Goals Patient Stated Goal: to go home and get back to cooking OT Goal Formulation: All assessment and education complete, DC therapy Time For Goal Achievement: 05/12/18 Potential to Achieve Goals: Good  OT Frequency:     Barriers to D/C:            Co-evaluation              AM-PAC PT "6 Clicks" Daily Activity     Outcome Measure Help from another person eating meals?: None Help from another person taking care of personal grooming?: None Help from another person toileting, which includes using toliet, bedpan, or urinal?: A Little Help from another person bathing (including washing, rinsing, drying)?: A Little Help from another person to put on and taking off regular upper body clothing?: None Help from another person to put on and taking off regular lower body clothing?: A Little 6 Click Score: 21   End of Session Equipment Utilized During Treatment: Gait belt;Rolling walker;Other (comment)(reacher)  Activity Tolerance: Patient tolerated treatment well Patient left: in chair;with call bell/phone within reach;with chair alarm set;with family/visitor present;with SCD's reapplied  OT Visit Diagnosis: Other abnormalities of gait and mobility (R26.89);Pain Pain - Right/Left: Left Pain - part of body: Knee                Time: 6226-3335 OT Time Calculation (min): 62 min Charges:  Luna Fuse Jadavion Spoelstra OTS  04/28/2018, 1:47 PM

## 2018-04-28 NOTE — Progress Notes (Signed)
Clinical Social Worker (CSW) received SNF consult. PT is recommending home health. RN case manager aware of above. Please reconsult if future social work needs arise. CSW signing off.   Jaylon Grode, LCSW [phone] 

## 2018-04-28 NOTE — Progress Notes (Signed)
PT Cancellation Note  Patient Details Name: Stephanie Mathews MRN: [id number] DOB: 02-03-1961   Cancelled Treatment:    Reason Eval/Treat Not Completed: Other (comment).  Attempted to see pt x2.  On first attempt pt eating breakfast, on second attempt pt taking a sponge bath.  Will attempt to see pt again this morning for PT evaluation.    Collie Siad PT, DPT 04/28/2018, 9:31 AM

## 2018-04-28 NOTE — Progress Notes (Signed)
Physical Therapy Treatment Patient Details Name: Stephanie Mathews MRN: [id number] DOB: Aug 14, 1960 Today's Date: 04/28/2018    History of Present Illness Pt is a 57 y/o F s/p L TKA.  Pt's PMH includes morbid obesity.    PT Comments    Pt is very pleasant and agreeable to PT. Pt performs unable to perform 10 SLRs without assist, requires L knee immobilizer with all OOB activity. Tolerated all other ther-ex independently without inc in pain. Pt educated on HEP including frequency and duration. Pt also educated on polar care, donning and doffing knee immobilizer and need to wear with OOB activity at this time. With transfers and ambulation pt requires CGA for safety, no apparent LOB. Will continue to progress mobility and strength as able.   Follow Up Recommendations  Home health PT;Supervision for mobility/OOB     Equipment Recommendations  Other (comment)(Bari RW, tub bench)    Recommendations for Other Services       Precautions / Restrictions Precautions Precautions: Knee Precaution Booklet Issued: Yes (comment) Required Braces or Orthoses: Knee Immobilizer - Left Restrictions Weight Bearing Restrictions: Yes LLE Weight Bearing: Weight bearing as tolerated    Mobility  Bed Mobility Overal bed mobility: Needs Assistance Bed Mobility: Sit to Supine     Supine to sit: Supervision Sit to supine: Min guard   General bed mobility comments: Pt has some difficulty lifting LLE into bed, requires some assist to initate movement.  Transfers Overall transfer level: Needs assistance Equipment used: Rolling walker (2 wheeled) Transfers: Sit to/from Stand Sit to Stand: Min guard        Lateral/Scoot Transfers: Supervision General transfer comment: Pt demonstrates proper hand placement without cues.  Pt slow to rise.  Poorly controlled descent to sit during end phase of sitting.  At end of session the pt scooted to the L sitting EOB toward [facility] with supervision for safety.    Ambulation/Gait Ambulation/Gait assistance: Min guard Gait Distance (Feet): 200 Feet Assistive device: Rolling walker (2 wheeled) Gait Pattern/deviations: Step-through pattern;Decreased weight shift to left;Antalgic;Wide base of support Gait velocity: decreased   General Gait Details: Pt ambulates with knee immobilizer. Has reciprocal gait and dec heel contact of L foot. No dizziness or LOB.   Stairs             Wheelchair Mobility    Modified Rankin (Stroke Patients Only)       Balance Overall balance assessment: Needs assistance Sitting-balance support: Feet supported;No upper extremity supported Sitting balance-Leahy Scale: Good     Standing balance support: Bilateral upper extremity supported;During functional activity Standing balance-Leahy Scale: Poor Standing balance comment: Relies on at least 1UE support for static activities                            Cognition Arousal/Alertness: Awake/alert Behavior During Therapy: WFL for tasks assessed/performed Overall Cognitive Status: Within Functional Limits for tasks assessed                                        Exercises Other Exercises Other Exercises: Pt educated in polar care and compression sock mgt including donning/doffing, wear schedule and positioning. Other Exercises: Pt educated in falls risk prevention strategies to maximize safety. Other Exercises: Pt educated in LB dressing techniques using approriate AE to maximize independence and decrease caregiver burden.  Other Exercises: Seated in recliner LLE  AROM: ankle pumps, hip ABD/ADD, LAQs, and heel slides. AAROM with Min A: SLRs. All ther-ex performed 12 reps with VC for technique.    General Comments General comments (skin integrity, edema, etc.): Pt was orthostatic but asymptomatic during functional mobility. In supine BP 144/69, Sitting EOB 139/75, standing 121/53. Able to get from bed to recliner. Denied  lightheadedness throughout session.       Pertinent Vitals/Pain Pain Assessment: 0-10 Pain Score: 3  Pain Location: L knee Pain Descriptors / Indicators: Aching Pain Intervention(s): Limited activity within patient's tolerance;Monitored during session    [place] expects to be discharged to:: Private residence Living Arrangements: Spouse/significant other;Children Available Help at Discharge: Family;Available 24 hours/day Type of Home: House Home Access: Stairs to enter Entrance Stairs-Rails: Left Home Layout: One level Home Equipment: Cane - single point      Prior Function Level of Independence: Independent with assistive device(s)      Comments: Pt ambulating community distances with SPC.  Pt reports frequent stumbling due to L knee pain/instability but no falls in the past 6 months.    PT Goals (current goals can now be found in the care plan section) Acute Rehab PT Goals Patient Stated Goal: to go home and get back to cooking PT Goal Formulation: With patient Time For Goal Achievement: 05/12/18 Potential to Achieve Goals: Good Progress towards PT goals: Progressing toward goals    Frequency    BID      PT Plan Current plan remains appropriate    Co-evaluation              AM-PAC PT "6 Clicks" Daily Activity  Outcome Measure  Difficulty turning over in bed (including adjusting bedclothes, sheets and blankets)?: A Little Difficulty moving from lying on back to sitting on the side of the bed? : A Little Difficulty sitting down on and standing up from a chair with arms (e.g., wheelchair, bedside commode, etc,.)?: Unable Help needed moving to and from a bed to chair (including a wheelchair)?: A Little Help needed walking in hospital room?: A Little Help needed climbing 3-5 steps with a railing? : A Little 6 Click Score: 16    End of Session Equipment Utilized During Treatment: Gait belt Activity Tolerance: Patient tolerated treatment  well Patient left: in bed;with call bell/phone within reach;with bed alarm set;with SCD's reapplied Nurse Communication: Mobility status;Other (comment)(Requires L knee immobilizer with OOB activity) PT Visit Diagnosis: Pain;Muscle weakness (generalized) (M62.81);Other abnormalities of gait and mobility (R26.89) Pain - Right/Left: Left Pain - part of body: Knee     Time: 4967-5916 PT Time Calculation (min) (ACUTE ONLY): 48 min  Charges:                        Algis Downs, SPT 04/28/2018, 5:06 PM

## 2018-04-28 NOTE — Progress Notes (Signed)
   Subjective: 1 Day Post-Op Procedure(s) (LRB): COMPUTER ASSISTED TOTAL KNEE ARTHROPLASTY (Left) Patient reports pain as 2 on 0-10 scale.   Patient is well, and has had no acute complaints or problems We will start therapy today.  Sit at the edge of the bed this morning and dangle her leg. Plan is to go Home after hospital stay. no nausea and no vomiting Patient denies any chest pains or shortness of breath. Objective: Vital signs in last 24 hours: Temp:  [96.8 F (36 C)-98.6 F (37 C)] 98.6 F (37 C) (10/15 0002) Pulse Rate:  [80-111] 102 (10/15 0002) Resp:  [12-20] 19 (10/15 0002) BP: (115-148)/(48-85) 125/68 (10/15 0002) SpO2:  [94 %-100 %] 94 % (10/15 0002) Weight:  [123.7 kg-125.4 kg] 125.4 kg (10/14 1625) Heels are non tender and elevated off the bed using rolled towels as well as bone foam under operative heel  Intake/Output from previous day: 10/14 0701 - 10/15 0700 In: 2110 [P.O.:240; I.V.:1620; IV Piggyback:250] Out: 7619 [Urine:4350; Drains:35; Blood:50] Intake/Output this shift: No intake/output data recorded.  No results for input(s): HGB in the last 72 hours. No results for input(s): WBC, RBC, HCT, PLT in the last 72 hours. No results for input(s): NA, K, CL, CO2, BUN, CREATININE, GLUCOSE, CALCIUM in the last 72 hours. No results for input(s): LABPT, INR in the last 72 hours.  EXAM General - Patient is Alert, Appropriate and Oriented Extremity - Neurologically intact Neurovascular intact Sensation intact distally Intact pulses distally Dorsiflexion/Plantar flexion intact Compartment soft Dressing - dressing C/D/I Motor Function - intact, moving foot and toes well on exam.    Past Medical History:  Diagnosis Date  . Abnormal uterine bleeding   . Arthritis   . Asthma   . Depression   . Diabetes mellitus without complication ([place])   . Eczema   . History of kidney stones   . Hyperlipidemia   . Hypothyroidism   . Kidney stones   . Morbid obesity  with BMI of 50.0-59.9, adult ([place])   . Myocardial infarction ([place])   . Rosacea   . Urinary incontinence, mixed     Assessment/Plan: 1 Day Post-Op Procedure(s) (LRB): COMPUTER ASSISTED TOTAL KNEE ARTHROPLASTY (Left) Active Problems:   S/P total knee arthroplasty  Estimated body mass index is 53.99 kg/m as calculated from the following:   Height as of this encounter: 5' (1.524 m).   Weight as of this encounter: 125.4 kg. Advance diet Up with therapy D/C IV fluids Discharge home with home health  Labs: None DVT Prophylaxis - Lovenox, Foot Pumps and TED hose Weight-Bearing as tolerated to left leg D/C O2 and Pulse OX and try on Room Air Patient needs bowel movement  Deashia Soule R. [employer] 04/28/2018, 7:31 AM

## 2018-04-28 NOTE — Discharge Summary (Signed)
Physician Discharge Summary  Patient ID: Stephanie Mathews MRN: [id number] DOB/AGE: Jun 26, 1961 57 y.o.  Admit date: 04/27/2018 Discharge date: 04/29/2018  Admission Diagnoses:  PRIMARY OSTEOARTHRITIS OF LEFT KNEE   Discharge Diagnoses: Patient Active Problem List   Diagnosis Date Noted  . S/P total knee arthroplasty 04/27/2018  . Venous stasis dermatitis of both lower extremities 01/13/2018  . Lower limb ulcer, calf ([place]) 01/13/2018  . Asthma without status asthmaticus 01/11/2018  . Depression 01/11/2018  . Eczema 01/11/2018  . Hyperlipidemia 01/11/2018  . Kidney stones 01/11/2018  . Morbid obesity with body mass index (BMI) of 50.0 to 59.9 in adult ([place]) 01/11/2018  . Diabetes mellitus type 2, uncomplicated ([place]) 26/71/2458  . Hx of adenomatous colonic polyps 09/12/2017  . Primary osteoarthritis of left knee 03/18/2016  . Breast calcification, left 03/16/2015  . Vitamin D deficiency 01/24/2014    Past Medical History:  Diagnosis Date  . Abnormal uterine bleeding   . Arthritis   . Asthma   . Depression   . Diabetes mellitus without complication ([place])   . Eczema   . History of kidney stones   . Hyperlipidemia   . Hypothyroidism   . Kidney stones   . Morbid obesity with BMI of 50.0-59.9, adult ([place])   . Myocardial infarction ([place])   . Rosacea   . Urinary incontinence, mixed      Transfusion: No transfusions during this admission fracture ORIF   Consultants (if any):   Discharged Condition: Improved  Hospital Course: Stephanie Mathews is an 57 y.o. female who was admitted 04/27/2018 with a diagnosis of degenerative arthrosis left knee and went to the operating room on 04/27/2018 and underwent the above named procedures.    Surgeries:Procedure(s): COMPUTER ASSISTED TOTAL KNEE ARTHROPLASTY on 04/27/2018  PRE-OPERATIVE DIAGNOSIS: Degenerative arthrosis of the left knee, primary  POST-OPERATIVE DIAGNOSIS:  Same  PROCEDURE:  Left total knee arthroplasty  using computer-assisted navigation  SURGEON:  Marciano Sequin. M.D.  ASSISTANT:  Vance Peper, PA (present and scrubbed throughout the case, critical for assistance with exposure, retraction, instrumentation, and closure)  ANESTHESIA: spinal  ESTIMATED BLOOD LOSS: 50 mL  FLUIDS REPLACED: 1100 mL of crystalloid  TOURNIQUET TIME: 87 minutes  DRAINS: 2 medium Hemovac drains  SOFT TISSUE RELEASES: Anterior cruciate ligament, posterior cruciate ligament, deep and superficial medial collateral ligament, patellofemoral ligament  IMPLANTS UTILIZED: DePuy Attune size 6N posterior stabilized femoral component (cemented), size 6 rotating platform tibial component (cemented), 35 mm medialized dome patella (cemented), and a 5 mm stabilized rotating platform polyethylene insert.  INDICATIONS FOR SURGERY: Stephanie Mathews is a 58 y.o. year old female with a long history of progressive knee pain. X-rays demonstrated severe degenerative changes in tricompartmental fashion. The patient had not seen any significant improvement despite conservative nonsurgical intervention. After discussion of the risks and benefits of surgical intervention, the patient expressed understanding of the risks benefits and agree with plans for total knee arthroplasty.   The risks, benefits, and alternatives were discussed at length including but not limited to the risks of infection, bleeding, nerve injury, stiffness, blood clots, the need for revision surgery, cardiopulmonary complications, among others, and they were willing to proceed. Patient tolerated the surgery well. No complications .Patient was taken to PACU where she was stabilized and then transferred to the orthopedic floor.  Patient started on Lovenox 30 mg and q 12 oh hrs. Foot pumps applied bilaterally at 80 mm hgb. Heels elevated off bed with rolled towels. No evidence of DVT.  Calves non tender. Negative [place]. Physical therapy started on day #1 for  gait training and transfer with OT starting on  day #1 for ADL and assisted devices. Patient has done well with therapy. Ambulated greater than 200 feet upon being discharged.  Was able to ascend and descend 4 steps safely and independently  Patient's IV And Foley were discontinued on day #1 with Hemovac being discontinued on day #2. Dressing was changed on day 2 prior to patient being discharged   She was given perioperative antibiotics:  Anti-infectives (From admission, onward)   Start     Dose/Rate Route Frequency Ordered Stop   05/03/18 1000  fluconazole (DIFLUCAN) tablet 200 mg    Note to Pharmacy:  In the morning.     200 mg Oral Every Sun 04/27/18 1609     04/27/18 1800  ceFAZolin (ANCEF) 3 g in dextrose 5 % 50 mL IVPB     3 g 100 mL/hr over 30 Minutes Intravenous Every 6 hours 04/27/18 1609 04/28/18 1759   04/27/18 0934  ceFAZolin (ANCEF) 2-4 GM/100ML-% IVPB    Note to Pharmacy:  [facility] Cornea   : cabinet override      04/27/18 0934 04/27/18 1140   04/27/18 0600  ceFAZolin (ANCEF) IVPB 2g/100 mL premix     2 g 200 mL/hr over 30 Minutes Intravenous On call to O.R. 04/26/18 2228 04/27/18 1152    .  She was fitted with AV 1 compression foot pump devices, instructed on heel pumps, early ambulation, and fitted with TED stockings bilaterally for DVT prophylaxis.  She benefited maximally from the hospital stay and there were no complications.    Recent vital signs:  Vitals:   04/27/18 1909 04/28/18 0002  BP: (!) 144/71 125/68  Pulse: (!) 111 (!) 102  Resp: 18 19  Temp: 97.6 F (36.4 C) 98.6 F (37 C)  SpO2: 99% 94%    Recent laboratory studies:  Lab Results  Component Value Date   HGB 16.4 (H) 04/14/2018   HGB 16.1 (H) 12/31/2017   Lab Results  Component Value Date   WBC 7.0 04/14/2018   PLT 293 04/14/2018   Lab Results  Component Value Date   INR 0.95 04/14/2018   Lab Results  Component Value Date   NA 138 04/14/2018   K 4.0 04/14/2018   CL 102 04/14/2018    CO2 25 04/14/2018   BUN 29 (H) 04/14/2018   CREATININE 0.98 04/14/2018   GLUCOSE 109 (H) 04/14/2018    Discharge Medications:   Allergies as of 04/28/2018      Reactions   Chlorhexidine Itching, Rash   Actos [pioglitazone]    Fluid retention   Adhesive [tape]    Peels skin off   Ibuprofen Diarrhea, Nausea And Vomiting   Januvia [sitagliptin]    Headache   Lisinopril    Dizziness   Statins Diarrhea   Muscle cramps   Vicodin [hydrocodone-acetaminophen] Nausea And Vomiting   [place] 10 Mcg Pen [exenatide] Rash   Olive Oil Rash   Olives & Olive oil   Peanut Oil Rash   Peanuts & peanut oil      Medication List    STOP taking these medications   aspirin 81 MG tablet     TAKE these medications   B-12 5000 MCG Subl Place 5,000 mcg under the tongue 2 (two) times daily.   CALCIUM 500 +D 500-400 MG-UNIT Tabs Generic drug:  Calcium Carb-Cholecalciferol Take 1 tablet by mouth daily.   Chromium  Picolinate 200 MCG Tabs Take 200 mcg by mouth every evening.   clotrimazole-betamethasone cream Commonly known as:  LOTRISONE Apply 1 application topically 2 (two) times daily as needed (for yeast on skin.).   D-Mannose Powd Take 10 mLs by mouth 2 (two) times daily.   DESITIN MAXIMUM STRENGTH EX Apply 1 application topically daily as needed (wound care).   DIGESTIVE ADVANTAGE PO Take 1 capsule by mouth daily.   enoxaparin 40 MG/0.4ML injection Commonly known as:  LOVENOX Inject 0.4 mLs (40 mg total) into the skin daily for 14 days. Start taking on:  04/30/2018   FARXIGA 10 MG Tabs tablet Generic drug:  dapagliflozin propanediol Take 10 mg by mouth daily.   fluconazole 200 MG tablet Commonly known as:  DIFLUCAN Take 200 mg by mouth every Sunday. In the morning.   ivermectin 3 MG Tabs tablet Commonly known as:  STROMECTOL Take 21 mg by mouth See admin instructions. Use 2-3 times a year (take 7 tablets (21 mg) by once a week, then repeat dose once after 1 week)    levothyroxine 25 MCG tablet Commonly known as:  SYNTHROID, LEVOTHROID Take 25 mcg by mouth daily before breakfast. On an empty stomach   liraglutide 18 MG/3ML Sopn Commonly known as:  VICTOZA Inject 1.8 mg into the skin every morning.   loratadine 10 MG tablet Commonly known as:  CLARITIN Take 10 mg by mouth daily.   losartan 25 MG tablet Commonly known as:  COZAAR Take 12.5 mg by mouth daily.   Melatonin 5 MG Tabs Take 5 mg by mouth at bedtime.   metFORMIN 500 MG 24 hr tablet Commonly known as:  GLUCOPHAGE-XR Take 1,000 mg by mouth 2 (two) times daily.   multivitamin with minerals Tabs tablet Take 1 tablet by mouth daily. Centrum silver   mupirocin ointment 2 % Commonly known as:  BACTROBAN Place 1 application into the nose 2 (two) times daily.   niacin 500 MG tablet Commonly known as:  SLO-NIACIN Take 250 mg by mouth daily with lunch.   NOVOLOG FLEXPEN 100 UNIT/ML FlexPen Generic drug:  insulin aspart Inject 3-15 Units into the skin 4 (four) times daily - after meals and at bedtime.   OSTEO BI-FLEX JOINT SHIELD PO Take 1 tablet by mouth every evening.   oxyCODONE 5 MG immediate release tablet Commonly known as:  Oxy IR/ROXICODONE Take 1 tablet (5 mg total) by mouth every 4 (four) hours as needed for moderate pain (pain score 4-6).   oxyCODONE-acetaminophen 10-325 MG tablet Commonly known as:  PERCOCET Take 0.25 tablets by mouth at bedtime as needed for pain.   Polyethyl Glycol-Propyl Glycol 0.4-0.3 % Soln Place 1 drop into both eyes 4 (four) times daily as needed (for dry/irritated eyes.). Systane   pseudoephedrine 30 MG tablet Commonly known as:  SUDAFED Take 60 mg by mouth daily as needed for congestion (for sinus congestion.).   REFRESH TEARS 0.5 % Soln Generic drug:  carboxymethylcellulose Place 1 drop into both eyes 4 (four) times daily as needed (dry eyes.).   traMADol 50 MG tablet Commonly known as:  ULTRAM Take 1-2 tablets (50-100 mg total) by  mouth every 4 (four) hours as needed for moderate pain.   Turmeric Powd Take 5 mLs by mouth daily.   Vitamin D (Ergocalciferol) 50000 units Caps capsule Commonly known as:  DRISDOL Take 50,000 Units by mouth every 14 (fourteen) days.   Vitamin D3 5000 units Tabs Take 5,000 Units by mouth every evening.  Durable Medical Equipment  (From admission, onward)         Start     Ordered   04/27/18 1610  DME Walker rolling  Once    Question:  Patient needs a walker to treat with the following condition  Answer:  Total knee replacement status   04/27/18 1609   04/27/18 1610  DME Bedside commode  Once    Question:  Patient needs a bedside commode to treat with the following condition  Answer:  Total knee replacement status   04/27/18 1609          Diagnostic Studies: Dg Knee Left Port  Result Date: 04/27/2018 CLINICAL DATA:  Left knee post op EXAM: PORTABLE LEFT KNEE - 1-2 VIEW COMPARISON:  MR 10/29/2010 FINDINGS: 3 components of left knee arthroplasty project in expected location. No fracture or dislocation. Anterior surgical drain and skin staples. IMPRESSION: Left knee arthroplasty without apparent complication. Electronically Signed   By: Lucrezia Europe M.D.   On: 04/27/2018 15:23   Mm 3d Screen Breast Bilateral  Result Date: 04/16/2018 CLINICAL DATA:  Screening. EXAM: DIGITAL SCREENING BILATERAL MAMMOGRAM WITH TOMO AND CAD COMPARISON:  Previous exam(s). ACR Breast Density Category b: There are scattered areas of fibroglandular density. FINDINGS: There are no findings suspicious for malignancy. Images were processed with CAD. IMPRESSION: No mammographic evidence of malignancy. A result letter of this screening mammogram will be mailed directly to the patient. RECOMMENDATION: Screening mammogram in one year. (Code:SM-B-01Y) BI-RADS CATEGORY  1: Negative. Electronically Signed   By: Lovey Newcomer M.D.   On: 04/16/2018 13:41    Disposition:   Discharge Instructions     Increase activity slowly   Complete by:  As directed       Follow-up Information    Watt Climes, PA On 05/12/2018.   Specialty:  Physician Assistant Why:  at 10:15am Contact information: [place] [address] [phone]        Dereck Leep, MD On 06/09/2018.   Specialty:  Orthopedic Surgery Why:  at 10:00am Contact information: [place] [address] [phone]            Signed: Watt Climes 04/28/2018, 7:42 AM

## 2018-04-28 NOTE — Evaluation (Signed)
Physical Therapy Evaluation Patient Details Name: Stephanie Mathews MRN: [id number] DOB: 1960/10/24 Today's Date: 04/28/2018   History of Present Illness  Pt is a 57 y/o F s/p L TKA.  Pt's PMH includes morbid obesity.  Clinical Impression  Pt is s/p L TKA resulting in the deficits listed below (see PT Problem List). Stephanie Mathews was very motivated to work with therapy.  Her L knee ROM is 0-70 deg.  She ambulated 200 ft with RW. While ambulating, pt reports feeling her L knee wanting to move into hyperextension so cues provided to practice slight L knee flexion during L stance phase which pt reports eliminated this sensation. Pt with h/o L knee hyperextension episodes prior to surgery. After ambulating in hallway and turning to performing standing exercises at countertop the pt reports feeling lightheaded.  Pt instructed to back up to bed to sit and then pt decided to lie back due to lightheadedness and cold sweat.  BP taken in supine 110/54.  Symptoms resolved within 4 minutes.  Pt then returned to supine and was instructed to remain in bed for at least 30 minutes symptom free before attempting bed>chair. This was communicated with the RN.  Pt will benefit from skilled PT to increase their independence and safety with mobility to allow discharge to the venue listed below.     Follow Up Recommendations Home health PT;Supervision for mobility/OOB    Equipment Recommendations  Other (comment)(Bari RW; tub bench)    Recommendations for Other Services OT consult     Precautions / Restrictions Precautions Precautions: Fall;Other (comment);Knee Precaution Booklet Issued: No(reviewed no pillow under knee) Precaution Comments: 1 episode of lightheadedness during evaluation Required Braces or Orthoses: Knee Immobilizer - Left Knee Immobilizer - Left: Other (comment)("if unable to perform SLR") Restrictions Weight Bearing Restrictions: Yes LLE Weight Bearing: Weight bearing as tolerated       Mobility  Bed Mobility Overal bed mobility: Needs Assistance Bed Mobility: Sit to Supine       Sit to supine: Min guard   General bed mobility comments: Pt sitting EOB upon PT arrival.  Pt with some difficulty lifting LLE into bed but does not require physical assist or cues to do so.   Transfers Overall transfer level: Needs assistance Equipment used: Rolling walker (2 wheeled) Transfers: Sit to/from Stand;Lateral/Scoot Transfers Sit to Stand: Min guard        Lateral/Scoot Transfers: Supervision General transfer comment: Pt demonstrates proper hand placement without cues.  Pt slow to rise.  Poorly controlled descent to sit during end phase of sitting.  At end of session the pt scooted to the L sitting EOB toward [facility] with supervision for safety.   Ambulation/Gait Ambulation/Gait assistance: Min guard Gait Distance (Feet): 200 Feet Assistive device: Rolling walker (2 wheeled) Gait Pattern/deviations: Step-through pattern;Decreased weight shift to left;Antalgic;Wide base of support     General Gait Details: Pt reports feeling her L knee wanting to move into hyperextension so cues provided to practice slight L knee flexion during L stance phase which pt reports eliminated this sensation.  Pt demonstrates step through gait pattern without cues.    Stairs            Wheelchair Mobility    Modified Rankin (Stroke Patients Only)       Balance Overall balance assessment: Needs assistance Sitting-balance support: No upper extremity supported;Feet supported Sitting balance-Leahy Scale: Good     Standing balance support: Single extremity supported;During functional activity Standing balance-Leahy Scale: Poor Standing balance comment:  Relies on at least 1UE support for static activities and BUE support for dynamic activities                             Pertinent Vitals/Pain Pain Assessment: 0-10 Pain Score: 2  Pain Location: L knee Pain Descriptors /  Indicators: Aching Pain Intervention(s): Limited activity within patient's tolerance;Monitored during session;Premedicated before session;Repositioned;Ice applied    Home Living Family/patient expects to be discharged to:: Private residence Living Arrangements: Spouse/significant other;Children(two adult children) Available Help at Discharge: Family;Available 24 hours/day(family to take turns providing 24/7) Type of Home: House Home Access: Stairs to enter Entrance Stairs-Rails: Left Entrance Stairs-Number of Steps: 1 Home Layout: One level Home Equipment: Cane - single point      Prior Function Level of Independence: Independent with assistive device(s)         Comments: Pt ambulating community distances with SPC.  Pt reports frequent stumbling due to L knee pain/instability but no falls in the past 6 months.  Pt reports episodes of her L knee locking in hyperextension which was painful and she manually had to shift it into neutral/flexion     Hand Dominance        Extremity/Trunk Assessment   Upper Extremity Assessment Upper Extremity Assessment: Overall WFL for tasks assessed    Lower Extremity Assessment Lower Extremity Assessment: LLE deficits/detail LLE Deficits / Details: Pt able to perform SLR, LAQ.  Swelling and bandaging around L knee.        Communication   Communication: No difficulties  Cognition Arousal/Alertness: Awake/alert Behavior During Therapy: WFL for tasks assessed/performed Overall Cognitive Status: Within Functional Limits for tasks assessed                                        General Comments General comments (skin integrity, edema, etc.): After ambulating in hallway and turning to performing standing exercises at countertop the pt reports feeling lightheaded.  Pt instructed to back up to bed to sit and then pt decided to lie back due to lightheadedness and cold sweat.  BP taken in supine 110/54.  Symptoms resolved within 4  minutes.  Pt then returned to supine and was instructed to remain in bed for at least 30 minutes symptom free before attempting bed>chair. This was communicated with the RN.     Exercises Total Joint Exercises Ankle Circles/Pumps: AROM;Both;10 reps;Seated Quad Sets: Strengthening;Both;10 reps;Supine Heel Slides: AROM;Left;5 reps;Supine Hip ABduction/ADduction: AROM;Left;10 reps;Supine Straight Leg Raises: Strengthening;Left;10 reps;Supine Long Arc Quad: Strengthening;Left;15 reps;Seated Knee Flexion: AAROM;Left;15 reps;Seated Goniometric ROM: 0-70 deg Other Exercises Other Exercises: Discussed safety in her home, proper footwear, home layout, decluttering, etc.    Assessment/Plan    PT Assessment Patient needs continued PT services  PT Problem List Decreased strength;Decreased range of motion;Decreased activity tolerance;Decreased balance;Decreased mobility;Decreased knowledge of use of DME;Decreased safety awareness;Pain       PT Treatment Interventions DME instruction;Gait training;Stair training;Functional mobility training;Therapeutic activities;Therapeutic exercise;Balance training;Neuromuscular re-education;Patient/family education;Modalities;Manual techniques    PT Goals (Current goals can be found in the Care Plan section)  Acute Rehab PT Goals Patient Stated Goal: to go home and improve independence PT Goal Formulation: With patient Time For Goal Achievement: 05/12/18 Potential to Achieve Goals: Good    Frequency BID   Barriers to discharge        Co-evaluation  AM-PAC PT "6 Clicks" Daily Activity  Outcome Measure Difficulty turning over in bed (including adjusting bedclothes, sheets and blankets)?: A Little Difficulty moving from lying on back to sitting on the side of the bed? : A Little Difficulty sitting down on and standing up from a chair with arms (e.g., wheelchair, bedside commode, etc,.)?: A Little Help needed moving to and from a bed  to chair (including a wheelchair)?: A Little Help needed walking in hospital room?: A Little Help needed climbing 3-5 steps with a railing? : A Little 6 Click Score: 18    End of Session Equipment Utilized During Treatment: Gait belt Activity Tolerance: Treatment limited secondary to medical complications (Comment)(due to lightheaded episode x1) Patient left: in bed;with call bell/phone within reach;with bed alarm set;with SCD's reapplied;Other (comment);with family/visitor present(with polar care and bone foam in place) Nurse Communication: Mobility status;Other (comment)(lightheaded episode and BP reading) PT Visit Diagnosis: Pain;Muscle weakness (generalized) (M62.81);Other abnormalities of gait and mobility (R26.89) Pain - Right/Left: Left Pain - part of body: Knee    Time: 1001-1047 PT Time Calculation (min) (ACUTE ONLY): 46 min   Charges:   PT Evaluation $PT Eval Moderate Complexity: 1 Mod PT Treatments $Gait Training: 8-22 mins $Therapeutic Exercise: 8-22 mins        Collie Siad PT, DPT 04/28/2018, 11:21 AM

## 2018-04-28 NOTE — Care Management Note (Signed)
Case Management Note  Patient Details  Name: Stephanie Mathews MRN: [id number] Date of Birth: April 03, 1961  Subjective/Objective:  POD # 1 left TKA. Met with patient at bedside to discuss discharge planning. Patient will discharge home with the care of her family at discharge. Offered a list of home care agencies. Referral to [facility] for HHPT. She will need a rolling walker and a bariatric bsc. Ordered from [place] with [facility]. Pharmacy: [facility]: [phone]. Cost of Lovenox is $ 10. Patient updated.                   Action/Plan: [facility] for HHPT. Lovenox is ready for pick up. DME per [facility]   Expected Discharge Date:  04/29/18               Expected Discharge Plan:  [place]  In-House Referral:     Discharge planning Services  CM Consult  Post Acute Care Choice:  Home Health Choice offered to:  Patient  DME Arranged:  Bedside commode, Walker rolling DME Agency:  [place]:  PT [place] Agency:  [facility]  Status of Service:  In process, will continue to follow  If discussed at Long Length of Stay Meetings, dates discussed:    Additional Comments:  Jolly Mango, RN 04/28/2018, 3:38 PM

## 2018-04-28 NOTE — Anesthesia Postprocedure Evaluation (Signed)
Anesthesia Post Note  Patient: Stephanie Mathews  Procedure(s) Performed: COMPUTER ASSISTED TOTAL KNEE ARTHROPLASTY (Left )  Patient location during evaluation: PACU Anesthesia Type: Spinal Level of consciousness: awake and alert Pain management: pain level controlled Vital Signs Assessment: post-procedure vital signs reviewed and stable Respiratory status: spontaneous breathing, nonlabored ventilation, respiratory function stable and patient connected to nasal cannula oxygen Cardiovascular status: blood pressure returned to baseline and stable Postop Assessment: no apparent nausea or vomiting Anesthetic complications: no     Last Vitals:  Vitals:   04/28/18 0002 04/28/18 0748  BP: 125/68 120/65  Pulse: (!) 102 85  Resp: 19   Temp: 37 C 36.5 C  SpO2: 94% 98%    Last Pain:  Vitals:   04/28/18 0914  TempSrc:   PainSc: [place]

## 2018-04-29 LAB — GLUCOSE, CAPILLARY
Glucose-Capillary: 271 mg/dL — ABNORMAL HIGH (ref 70–99)
Glucose-Capillary: 395 mg/dL — ABNORMAL HIGH (ref 70–99)

## 2018-04-29 MED ORDER — INSULIN ASPART 100 UNIT/ML ~~LOC~~ SOLN
0.0000 [IU] | Freq: Three times a day (TID) | SUBCUTANEOUS | Status: DC
  Administered 2018-04-29: 11 [IU] via SUBCUTANEOUS
  Administered 2018-04-29: 20 [IU] via SUBCUTANEOUS
  Filled 2018-04-29 (×2): qty 1

## 2018-04-29 NOTE — Progress Notes (Signed)
Inpatient Diabetes Program Recommendations  AACE/ADA: New Consensus Statement on Inpatient Glycemic Control (2019)  Target Ranges:  Prepandial:   less than 140 mg/dL      Peak postprandial:   less than 180 mg/dL (1-2 hours)      Critically ill patients:  140 - 180 mg/dL   Results for [name], [name] (MRN [id number]) as of 04/29/2018 08:48  Ref. Range 04/28/2018 07:45 04/28/2018 11:53 04/28/2018 16:48 04/28/2018 21:47 04/29/2018 07:55  Glucose-Capillary Latest Ref Range: 70 - 99 mg/dL 291 (H) 299 (H) 298 (H) 334 (H) 271 (H)  Results for [name], [name] (MRN [id number]) as of 04/29/2018 08:48  Ref. Range 12/31/2017 10:09 04/14/2018 12:36  Hemoglobin A1C Latest Ref Range: 4.8 - 5.6 % 6.9 (H) 7.8 (H)   Review of Glycemic Control  Diabetes history: DM2 Outpatient Diabetes medications: Farxiga 10 mg daily, Novolog 3-15 units QID, Victoza 1.8 mg QAM, Metformin XR 1000 mg BID Current orders for Inpatient glycemic control: Novolog 0-20 units TID with meals, Metformin XR 1000 mg BID  Inpatient Diabetes Program Recommendations: Insulin - Basal: Please consider ordering Lantus 10 units x1 now .  NOTE: Noted consult for postop hyperglycemia. Patient received Decadron 8 mg x 1 on 04/27/18 which is contributing to hyperglycemia. Recommend ordering one time Lantus 10 units x 1 now.   Thanks, Barnie Alderman, RN, MSN, CDE Diabetes Coordinator Inpatient Diabetes Program [phone] (Team Pager from 8am to 5pm)

## 2018-04-29 NOTE — Progress Notes (Signed)
Patient is being discharged home with family. IV removed with cath intact. Dressing changed and extra dressing sent with patient. Scripts, meds, and last dose reviewed. Allowed time for questions.

## 2018-04-29 NOTE — Progress Notes (Signed)
   Subjective: 2 Days Post-Op Procedure(s) (LRB): COMPUTER ASSISTED TOTAL KNEE ARTHROPLASTY (Left) Patient reports pain as mild.   Patient is well, and has had no acute complaints or problems Patient did very well with physical therapy yesterday.  Was able to ambulate greater than 200 feet.  Range of motion 0 to 70 degrees.  Still needs to do stairs prior to being discharged Plan is to go Home after hospital stay. no nausea and no vomiting Patient denies any chest pains or shortness of breath. Objective: Vital signs in last 24 hours: Temp:  [97.4 F (36.3 C)-98.6 F (37 C)] 97.7 F (36.5 C) (10/15 2327) Pulse Rate:  [85-111] 111 (10/15 2327) Resp:  [17-19] 19 (10/15 2327) BP: (120-146)/(53-69) 144/61 (10/15 2327) SpO2:  [95 %-98 %] 95 % (10/15 2327) well approximated incision Heels are non tender and elevated off the bed using rolled towels Intake/Output from previous day: 10/15 0701 - 10/16 0700 In: 886.9 [P.O.:480; IV Piggyback:406.9] Out: 10 [Drains:10] Intake/Output this shift: No intake/output data recorded.  No results for input(s): HGB in the last 72 hours. No results for input(s): WBC, RBC, HCT, PLT in the last 72 hours. No results for input(s): NA, K, CL, CO2, BUN, CREATININE, GLUCOSE, CALCIUM in the last 72 hours. No results for input(s): LABPT, INR in the last 72 hours.  EXAM General - Patient is Alert, Appropriate and Oriented Extremity - Neurologically intact Neurovascular intact Sensation intact distally Intact pulses distally Dorsiflexion/Plantar flexion intact No cellulitis present Compartment soft Dressing - Honeycomb dressing saturated with some oozing from beneath the distal end of the dressing.  Appears to be mainly blood.  Appears to have discontinued. Motor Function - intact, moving foot and toes well on exam.    Past Medical History:  Diagnosis Date  . Abnormal uterine bleeding   . Arthritis   . Asthma   . Depression   . Diabetes mellitus  without complication ([place])   . Eczema   . History of kidney stones   . Hyperlipidemia   . Hypothyroidism   . Kidney stones   . Morbid obesity with BMI of 50.0-59.9, adult ([place])   . Myocardial infarction ([place])   . Rosacea   . Urinary incontinence, mixed     Assessment/Plan: 2 Days Post-Op Procedure(s) (LRB): COMPUTER ASSISTED TOTAL KNEE ARTHROPLASTY (Left) Active Problems:   S/P total knee arthroplasty  Estimated body mass index is 53.99 kg/m as calculated from the following:   Height as of this encounter: 5' (1.524 m).   Weight as of this encounter: 125.4 kg. Up with therapy Discharge home with home health  Labs: None DVT Prophylaxis - Lovenox, Foot Pumps and TED hose Weight-Bearing as tolerated to left leg Hemovac discontinued this morning.  Into the drain appeared to be intact. Please change dressing on operative leg, wash leg and apply TED stockings to both legs prior to being discharged Patient needs bowel movement prior to being discharged  [place]. [place] 04/29/2018, 6:50 AM

## 2018-04-29 NOTE — Care Management (Signed)
[facility] unable to go to patients address in [place]. [facility] declined patient as well. [facility] able to accept patient. They will see patient tomorrow. Patient updated and is in agreement with POC. Notified Dr. Marry Guan of change in plan of care.

## 2018-04-29 NOTE — Care Management Note (Signed)
Case Management Note  Patient Details  Name: Stephanie Mathews MRN: [id number] Date of Birth: 1960/07/25  Subjective/Objective:  Discharging today                  Action/Plan: [facility] notified of discharge. DME to be delivered this AM from [facility].   Expected Discharge Date:  04/29/18               Expected Discharge Plan:  [place]  In-House Referral:     Discharge planning Services  CM Consult  Post Acute Care Choice:  Home Health Choice offered to:  Patient  DME Arranged:  Bedside commode, Walker rolling DME Agency:  [place]:  PT [place] Agency:  [facility] (formerly [facility]  Status of Service:  In process, will continue to follow  If discussed at Long Length of Stay Meetings, dates discussed:    Additional Comments:  Jolly Mango, RN 04/29/2018, 8:47 AM

## 2018-04-29 NOTE — Progress Notes (Signed)
Physical Therapy Treatment Patient Details Name: Stephanie Mathews MRN: [id number] DOB: July 27, 1960 Today's Date: 04/29/2018    History of Present Illness Pt is a 57 y/o F s/p L TKA.  Pt's PMH includes morbid obesity.    PT Comments    Pt continues to progress toward her goals. Pt is extremely motivated to return to her PLOF. Pt performs 10 SLRs independently, no longer requires knee immobilizer for OOB. Pt ambulates 4 stairs with good awareness of safety and uses AD appropriately without VC. Pt is much steadier on feet this morning ambulating with inc step length. D/t pain this morning did not progress ambulation endurance. Pt did perform toileting activities with increased independence demonstrating improved postural stability, can maintain quiet standing at sink without UE support for > 2 minutes. Will continue to progress mobility and strength as able.   Follow Up Recommendations  Home health PT;Supervision for mobility/OOB     Equipment Recommendations  Other (comment)(bari RW, tub bench)    Recommendations for Other Services       Precautions / Restrictions Precautions Precautions: Knee Precaution Booklet Issued: Yes (comment) Restrictions Weight Bearing Restrictions: Yes LLE Weight Bearing: Weight bearing as tolerated    Mobility  Bed Mobility Overal bed mobility: Needs Assistance Bed Mobility: Sit to Supine;Supine to Sit     Supine to sit: Supervision Sit to supine: Supervision   General bed mobility comments: Uses overhead trapeze to scoot up in bed with half bridge on RLE. Uses bed handle to rotate trunk. Requires increased time to perform.  Transfers Overall transfer level: Needs assistance Equipment used: Rolling walker (2 wheeled) Transfers: Sit to/from Stand Sit to Stand: Supervision        Lateral/Scoot Transfers: Supervision General transfer comment: Pt demonstrates proper hand placement without cues.  Pt slow to rise.  Poorly controlled descent to  sit during end phase of sitting.  Ambulation/Gait Ambulation/Gait assistance: Min guard;Supervision Gait Distance (Feet): 100 Feet Assistive device: Rolling walker (2 wheeled) Gait Pattern/deviations: Step-through pattern;Decreased weight shift to left;Antalgic;Wide base of support Gait velocity: decreased   General Gait Details: Has reciprocal gait and dec heel contact of L foot. No dizziness or LOB.   Stairs Stairs: Yes Stairs assistance: Min guard Stair Management: One rail Left Number of Stairs: 4 General stair comments: Pt ambulates with step-to pattern. Uses L railing and SPC in R hand. Demonstrates safety awareness, does not require VC for sequencing. No apparent LOB. Requires increased time to perform.   Wheelchair Mobility    Modified Rankin (Stroke Patients Only)       Balance Overall balance assessment: Needs assistance Sitting-balance support: Feet supported;No upper extremity supported Sitting balance-Leahy Scale: Good     Standing balance support: During functional activity;No upper extremity supported Standing balance-Leahy Scale: Fair Standing balance comment: Pt able to wash hands at sink without LOB, wide BOS noted in stance.                            Cognition Arousal/Alertness: Awake/alert Behavior During Therapy: WFL for tasks assessed/performed Overall Cognitive Status: Within Functional Limits for tasks assessed                                        Exercises Other Exercises Other Exercises: Supine LLE AROM: ankle pumps, quad sets, SLRs, hip ABD/ADD. All ther-ex performed 15 reps with minimal  VC for technique. Other Exercises: Seated in recliner LLE AROM: LAQs and heel slides. All ther-ex performed 15 reps with minimal VC for technique. Other Exercises: Toileting activity at commode and hand hygiene at sink with supervision.    General Comments        Pertinent Vitals/Pain Pain Assessment: 0-10 Pain Score: 9   Pain Location: L knee Pain Descriptors / Indicators: Aching Pain Intervention(s): Limited activity within patient's tolerance;Monitored during session;Premedicated before session    Home Living                      Prior Function            PT Goals (current goals can now be found in the care plan section) Acute Rehab PT Goals Patient Stated Goal: to go home and get back to cooking PT Goal Formulation: With patient Time For Goal Achievement: 05/12/18 Potential to Achieve Goals: Good Progress towards PT goals: Progressing toward goals    Frequency    BID      PT Plan Current plan remains appropriate    Co-evaluation              AM-PAC PT "6 Clicks" Daily Activity  Outcome Measure  Difficulty turning over in bed (including adjusting bedclothes, sheets and blankets)?: A Little Difficulty moving from lying on back to sitting on the side of the bed? : A Little Difficulty sitting down on and standing up from a chair with arms (e.g., wheelchair, bedside commode, etc,.)?: A Little Help needed moving to and from a bed to chair (including a wheelchair)?: A Little Help needed walking in hospital room?: A Little Help needed climbing 3-5 steps with a railing? : A Little 6 Click Score: 18    End of Session Equipment Utilized During Treatment: Gait belt Activity Tolerance: Patient tolerated treatment well Patient left: in chair;with call bell/phone within reach;with chair alarm set;with SCD's reapplied Nurse Communication: Mobility status PT Visit Diagnosis: Pain;Muscle weakness (generalized) (M62.81);Other abnormalities of gait and mobility (R26.89) Pain - Right/Left: Left Pain - part of body: Knee     Time: 5631-4970 PT Time Calculation (min) (ACUTE ONLY): 39 min  Charges:                        Algis Downs, SPT 04/29/2018, 10:58 AM

## 2018-05-12 DIAGNOSIS — M25662 Stiffness of left knee, not elsewhere classified: Secondary | ICD-10-CM | POA: Diagnosis not present

## 2018-05-12 DIAGNOSIS — M6281 Muscle weakness (generalized): Secondary | ICD-10-CM | POA: Diagnosis not present

## 2018-05-12 DIAGNOSIS — M25562 Pain in left knee: Secondary | ICD-10-CM | POA: Diagnosis not present

## 2018-05-12 DIAGNOSIS — Z96652 Presence of left artificial knee joint: Secondary | ICD-10-CM | POA: Diagnosis not present

## 2018-05-15 DIAGNOSIS — M25662 Stiffness of left knee, not elsewhere classified: Secondary | ICD-10-CM | POA: Diagnosis not present

## 2018-05-15 DIAGNOSIS — M6281 Muscle weakness (generalized): Secondary | ICD-10-CM | POA: Diagnosis not present

## 2018-05-15 DIAGNOSIS — M25562 Pain in left knee: Secondary | ICD-10-CM | POA: Diagnosis not present

## 2018-05-15 DIAGNOSIS — Z96652 Presence of left artificial knee joint: Secondary | ICD-10-CM | POA: Diagnosis not present

## 2018-05-18 DIAGNOSIS — M25662 Stiffness of left knee, not elsewhere classified: Secondary | ICD-10-CM | POA: Diagnosis not present

## 2018-05-18 DIAGNOSIS — M25562 Pain in left knee: Secondary | ICD-10-CM | POA: Diagnosis not present

## 2018-05-18 DIAGNOSIS — Z96652 Presence of left artificial knee joint: Secondary | ICD-10-CM | POA: Diagnosis not present

## 2018-05-18 DIAGNOSIS — M6281 Muscle weakness (generalized): Secondary | ICD-10-CM | POA: Diagnosis not present

## 2018-05-20 DIAGNOSIS — Z96652 Presence of left artificial knee joint: Secondary | ICD-10-CM | POA: Diagnosis not present

## 2018-05-20 DIAGNOSIS — M25562 Pain in left knee: Secondary | ICD-10-CM | POA: Diagnosis not present

## 2018-05-20 DIAGNOSIS — M25662 Stiffness of left knee, not elsewhere classified: Secondary | ICD-10-CM | POA: Diagnosis not present

## 2018-05-20 DIAGNOSIS — M6281 Muscle weakness (generalized): Secondary | ICD-10-CM | POA: Diagnosis not present

## 2018-05-22 DIAGNOSIS — M25662 Stiffness of left knee, not elsewhere classified: Secondary | ICD-10-CM | POA: Diagnosis not present

## 2018-05-22 DIAGNOSIS — M25562 Pain in left knee: Secondary | ICD-10-CM | POA: Diagnosis not present

## 2018-05-22 DIAGNOSIS — Z96652 Presence of left artificial knee joint: Secondary | ICD-10-CM | POA: Diagnosis not present

## 2018-05-22 DIAGNOSIS — M6281 Muscle weakness (generalized): Secondary | ICD-10-CM | POA: Diagnosis not present

## 2018-05-25 DIAGNOSIS — Z96652 Presence of left artificial knee joint: Secondary | ICD-10-CM | POA: Diagnosis not present

## 2018-05-25 DIAGNOSIS — M25662 Stiffness of left knee, not elsewhere classified: Secondary | ICD-10-CM | POA: Diagnosis not present

## 2018-05-25 DIAGNOSIS — M25562 Pain in left knee: Secondary | ICD-10-CM | POA: Diagnosis not present

## 2018-05-25 DIAGNOSIS — M6281 Muscle weakness (generalized): Secondary | ICD-10-CM | POA: Diagnosis not present

## 2018-05-27 DIAGNOSIS — M6281 Muscle weakness (generalized): Secondary | ICD-10-CM | POA: Diagnosis not present

## 2018-05-27 DIAGNOSIS — Z96652 Presence of left artificial knee joint: Secondary | ICD-10-CM | POA: Diagnosis not present

## 2018-05-27 DIAGNOSIS — M25562 Pain in left knee: Secondary | ICD-10-CM | POA: Diagnosis not present

## 2018-05-27 DIAGNOSIS — M25662 Stiffness of left knee, not elsewhere classified: Secondary | ICD-10-CM | POA: Diagnosis not present

## 2018-06-01 DIAGNOSIS — M25562 Pain in left knee: Secondary | ICD-10-CM | POA: Diagnosis not present

## 2018-06-01 DIAGNOSIS — M25662 Stiffness of left knee, not elsewhere classified: Secondary | ICD-10-CM | POA: Diagnosis not present

## 2018-06-01 DIAGNOSIS — Z96652 Presence of left artificial knee joint: Secondary | ICD-10-CM | POA: Diagnosis not present

## 2018-06-01 DIAGNOSIS — M6281 Muscle weakness (generalized): Secondary | ICD-10-CM | POA: Diagnosis not present

## 2018-06-03 DIAGNOSIS — M25662 Stiffness of left knee, not elsewhere classified: Secondary | ICD-10-CM | POA: Diagnosis not present

## 2018-06-03 DIAGNOSIS — M25562 Pain in left knee: Secondary | ICD-10-CM | POA: Diagnosis not present

## 2018-06-03 DIAGNOSIS — Z96652 Presence of left artificial knee joint: Secondary | ICD-10-CM | POA: Diagnosis not present

## 2018-06-03 DIAGNOSIS — M6281 Muscle weakness (generalized): Secondary | ICD-10-CM | POA: Diagnosis not present

## 2018-06-08 DIAGNOSIS — M25562 Pain in left knee: Secondary | ICD-10-CM | POA: Diagnosis not present

## 2018-06-08 DIAGNOSIS — M25662 Stiffness of left knee, not elsewhere classified: Secondary | ICD-10-CM | POA: Diagnosis not present

## 2018-06-08 DIAGNOSIS — Z96652 Presence of left artificial knee joint: Secondary | ICD-10-CM | POA: Diagnosis not present

## 2018-06-08 DIAGNOSIS — M6281 Muscle weakness (generalized): Secondary | ICD-10-CM | POA: Diagnosis not present

## 2018-06-09 DIAGNOSIS — Z96652 Presence of left artificial knee joint: Secondary | ICD-10-CM | POA: Diagnosis not present

## 2018-06-09 DIAGNOSIS — M1712 Unilateral primary osteoarthritis, left knee: Secondary | ICD-10-CM | POA: Diagnosis not present

## 2018-08-07 DIAGNOSIS — Z794 Long term (current) use of insulin: Secondary | ICD-10-CM | POA: Diagnosis not present

## 2018-08-07 DIAGNOSIS — E1122 Type 2 diabetes mellitus with diabetic chronic kidney disease: Secondary | ICD-10-CM | POA: Diagnosis not present

## 2018-08-07 DIAGNOSIS — E782 Mixed hyperlipidemia: Secondary | ICD-10-CM | POA: Diagnosis not present

## 2018-08-07 DIAGNOSIS — E538 Deficiency of other specified B group vitamins: Secondary | ICD-10-CM | POA: Diagnosis not present

## 2018-08-07 DIAGNOSIS — E119 Type 2 diabetes mellitus without complications: Secondary | ICD-10-CM | POA: Diagnosis not present

## 2018-08-07 DIAGNOSIS — N183 Chronic kidney disease, stage 3 (moderate): Secondary | ICD-10-CM | POA: Diagnosis not present

## 2018-11-06 DIAGNOSIS — E559 Vitamin D deficiency, unspecified: Secondary | ICD-10-CM | POA: Diagnosis not present

## 2018-11-06 DIAGNOSIS — E782 Mixed hyperlipidemia: Secondary | ICD-10-CM | POA: Diagnosis not present

## 2018-11-06 DIAGNOSIS — E119 Type 2 diabetes mellitus without complications: Secondary | ICD-10-CM | POA: Diagnosis not present

## 2018-11-16 DIAGNOSIS — E039 Hypothyroidism, unspecified: Secondary | ICD-10-CM | POA: Diagnosis not present

## 2018-11-16 DIAGNOSIS — Z794 Long term (current) use of insulin: Secondary | ICD-10-CM | POA: Diagnosis not present

## 2018-11-16 DIAGNOSIS — E119 Type 2 diabetes mellitus without complications: Secondary | ICD-10-CM | POA: Diagnosis not present

## 2019-04-08 ENCOUNTER — Other Ambulatory Visit: Payer: Self-pay | Admitting: Obstetrics & Gynecology

## 2019-04-08 DIAGNOSIS — Z1231 Encounter for screening mammogram for malignant neoplasm of breast: Secondary | ICD-10-CM

## 2019-04-21 ENCOUNTER — Ambulatory Visit
Admission: RE | Admit: 2019-04-21 | Discharge: 2019-04-21 | Disposition: A | Discharge status: Home / Self Care | Payer: 59 | Source: Ambulatory Visit | Attending: Obstetrics & Gynecology | Admitting: Obstetrics & Gynecology

## 2019-04-21 ENCOUNTER — Other Ambulatory Visit: Payer: Self-pay

## 2019-04-21 DIAGNOSIS — Z1231 Encounter for screening mammogram for malignant neoplasm of breast: Secondary | ICD-10-CM | POA: Diagnosis present

## 2020-04-12 ENCOUNTER — Other Ambulatory Visit: Payer: Self-pay | Admitting: Obstetrics & Gynecology

## 2020-04-12 DIAGNOSIS — Z1231 Encounter for screening mammogram for malignant neoplasm of breast: Secondary | ICD-10-CM

## 2020-04-22 IMAGING — MG MM DIGITAL SCREENING BILAT W/ TOMO W/ CAD
8 of 14 series · 8 of 40 positions shown · non-contrast
Comparison: Previous exam(s).

CLINICAL DATA: Screening.

EXAM:
DIGITAL SCREENING BILATERAL MAMMOGRAM WITH TOMO AND CAD

[L MLO synth-2D (1 of 2)]
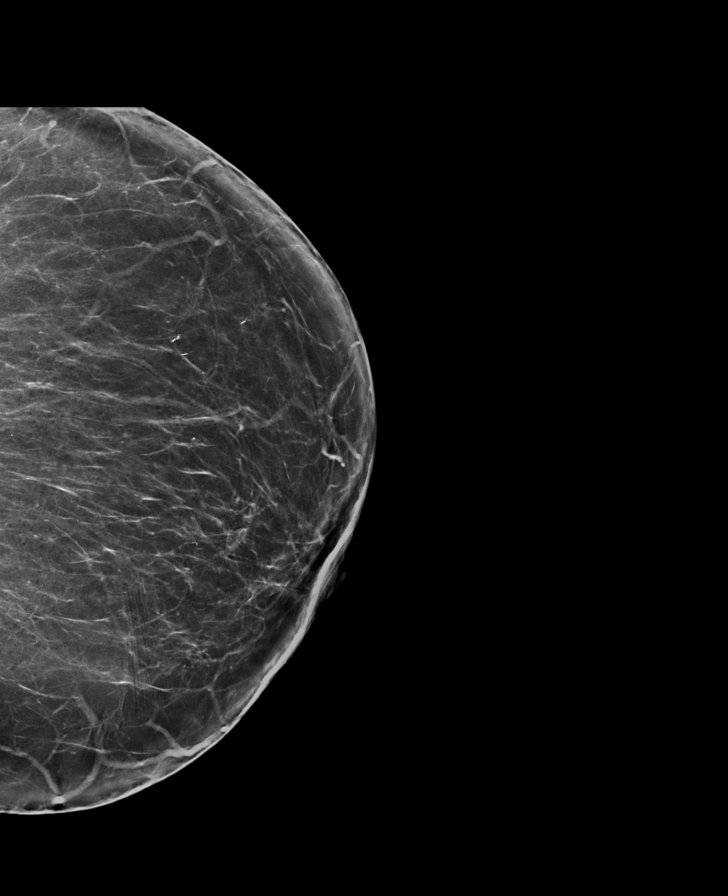

[L CC synth-2D (1 of 2)]
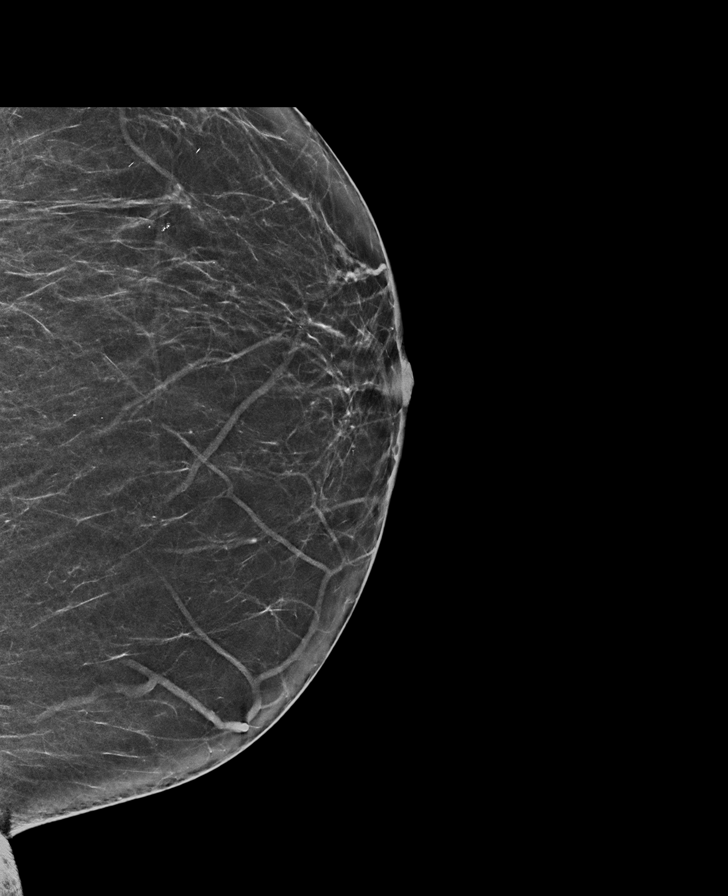

[R CC synth-2D (1 of 2)]
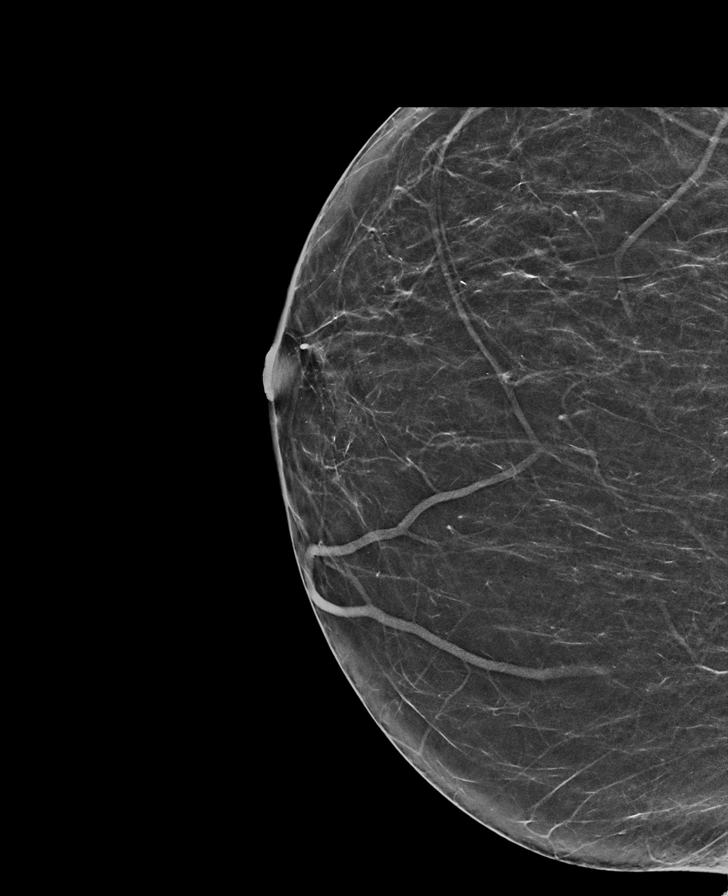

[L MLO synth-2D (2 of 2)]
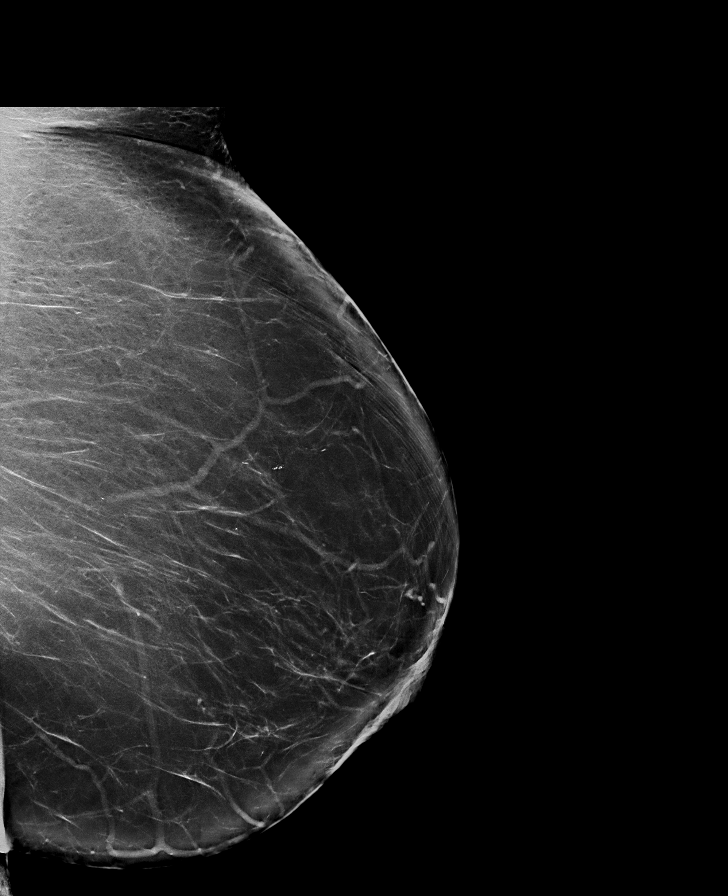

[L CC synth-2D (2 of 2)]
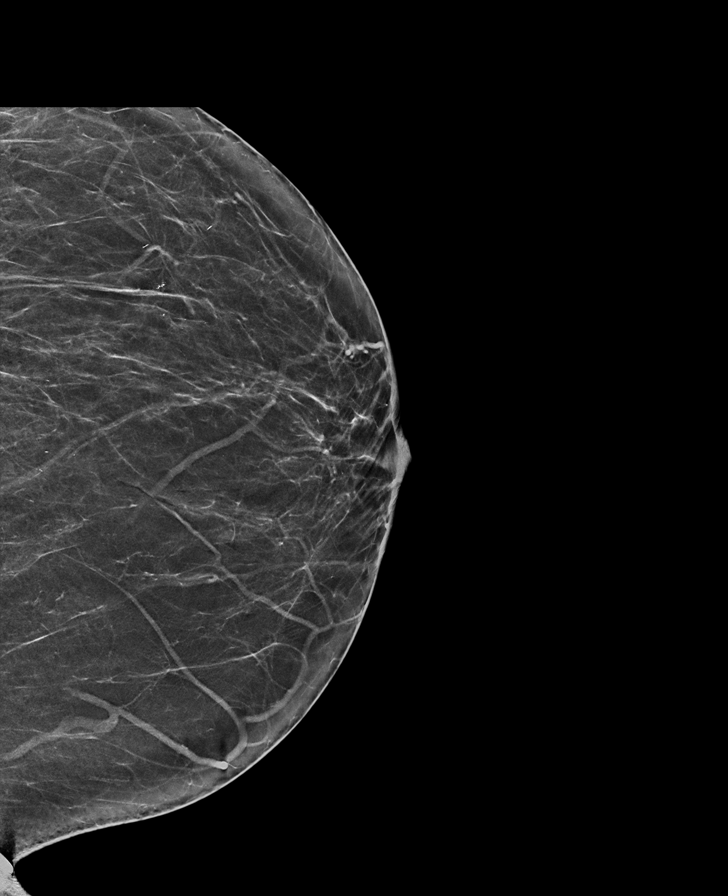

[R MLO synth-2D]
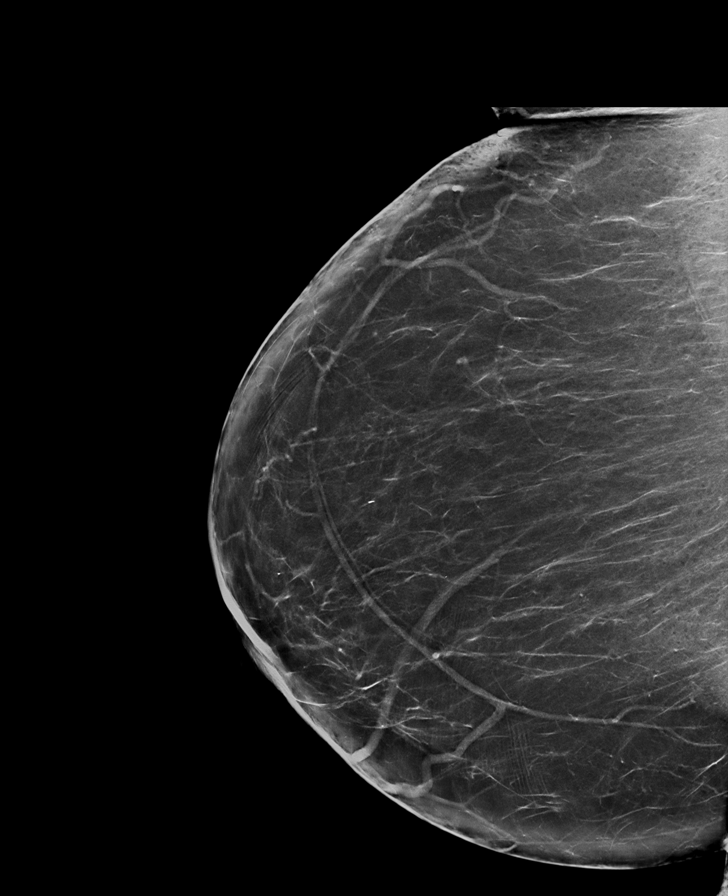

[R CC synth-2D (2 of 2)]
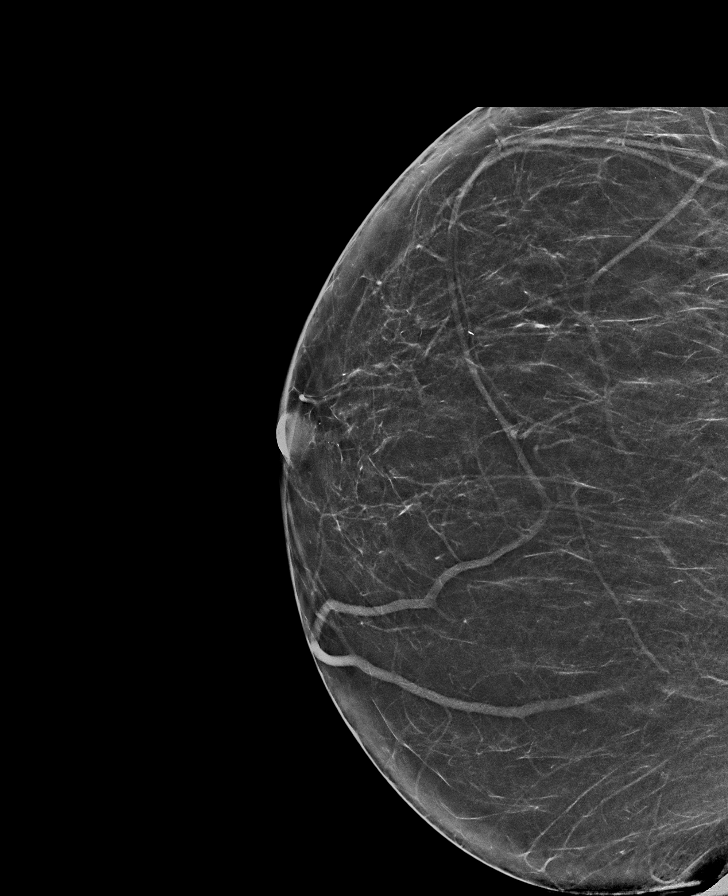

[L CC tomo · tomo slice 33/65.0]
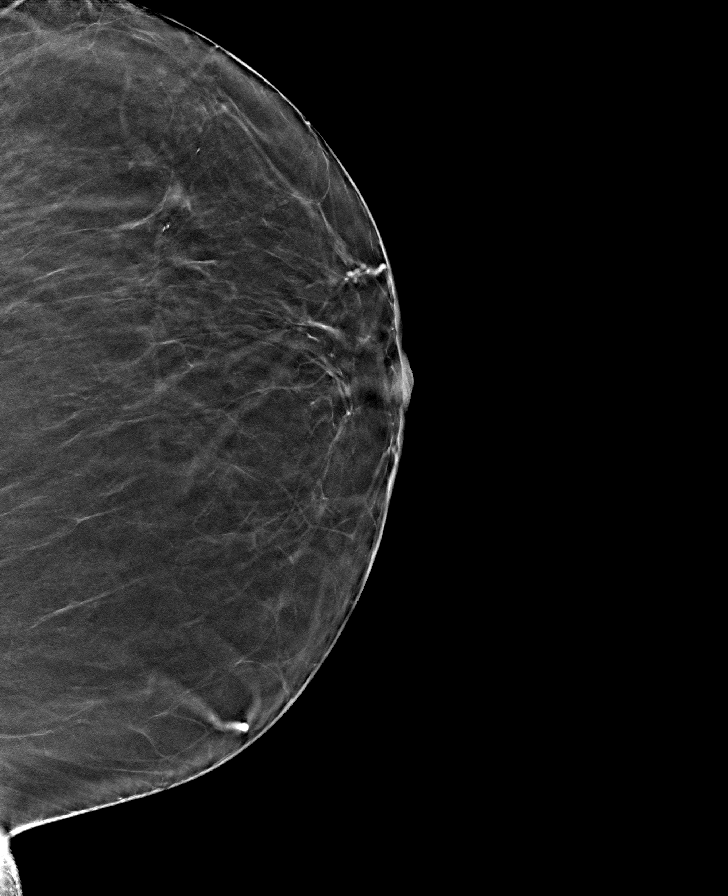

[8 of 40 positions shown; findings below may reference images not displayed]

ACR Breast Density Category b: There are scattered areas of
fibroglandular density.
FINDINGS: There are no findings suspicious for malignancy. Images were
processed with CAD.
IMPRESSION: No mammographic evidence of malignancy. A result letter of this
screening mammogram will be mailed directly to the patient.

RECOMMENDATION:
Screening mammogram in one year. (Code:[id number])

BI-RADS CATEGORY  1: Negative.

## 2020-04-25 ENCOUNTER — Ambulatory Visit
Admission: RE | Admit: 2020-04-25 | Discharge: 2020-04-25 | Disposition: A | Discharge status: Home / Self Care | Payer: BC Managed Care – PPO | Source: Ambulatory Visit | Attending: Obstetrics & Gynecology | Admitting: Obstetrics & Gynecology

## 2020-04-25 ENCOUNTER — Other Ambulatory Visit: Payer: Self-pay

## 2020-04-25 DIAGNOSIS — Z1231 Encounter for screening mammogram for malignant neoplasm of breast: Secondary | ICD-10-CM | POA: Insufficient documentation

## 2020-05-03 IMAGING — DX DG KNEE 1-2V PORT*L*
2 series · 2 of 2 positions shown · non-contrast
Comparison: MR 10/29/2010

CLINICAL DATA: Left knee post op

EXAM:
PORTABLE LEFT KNEE - 1-2 VIEW

[knee ap]
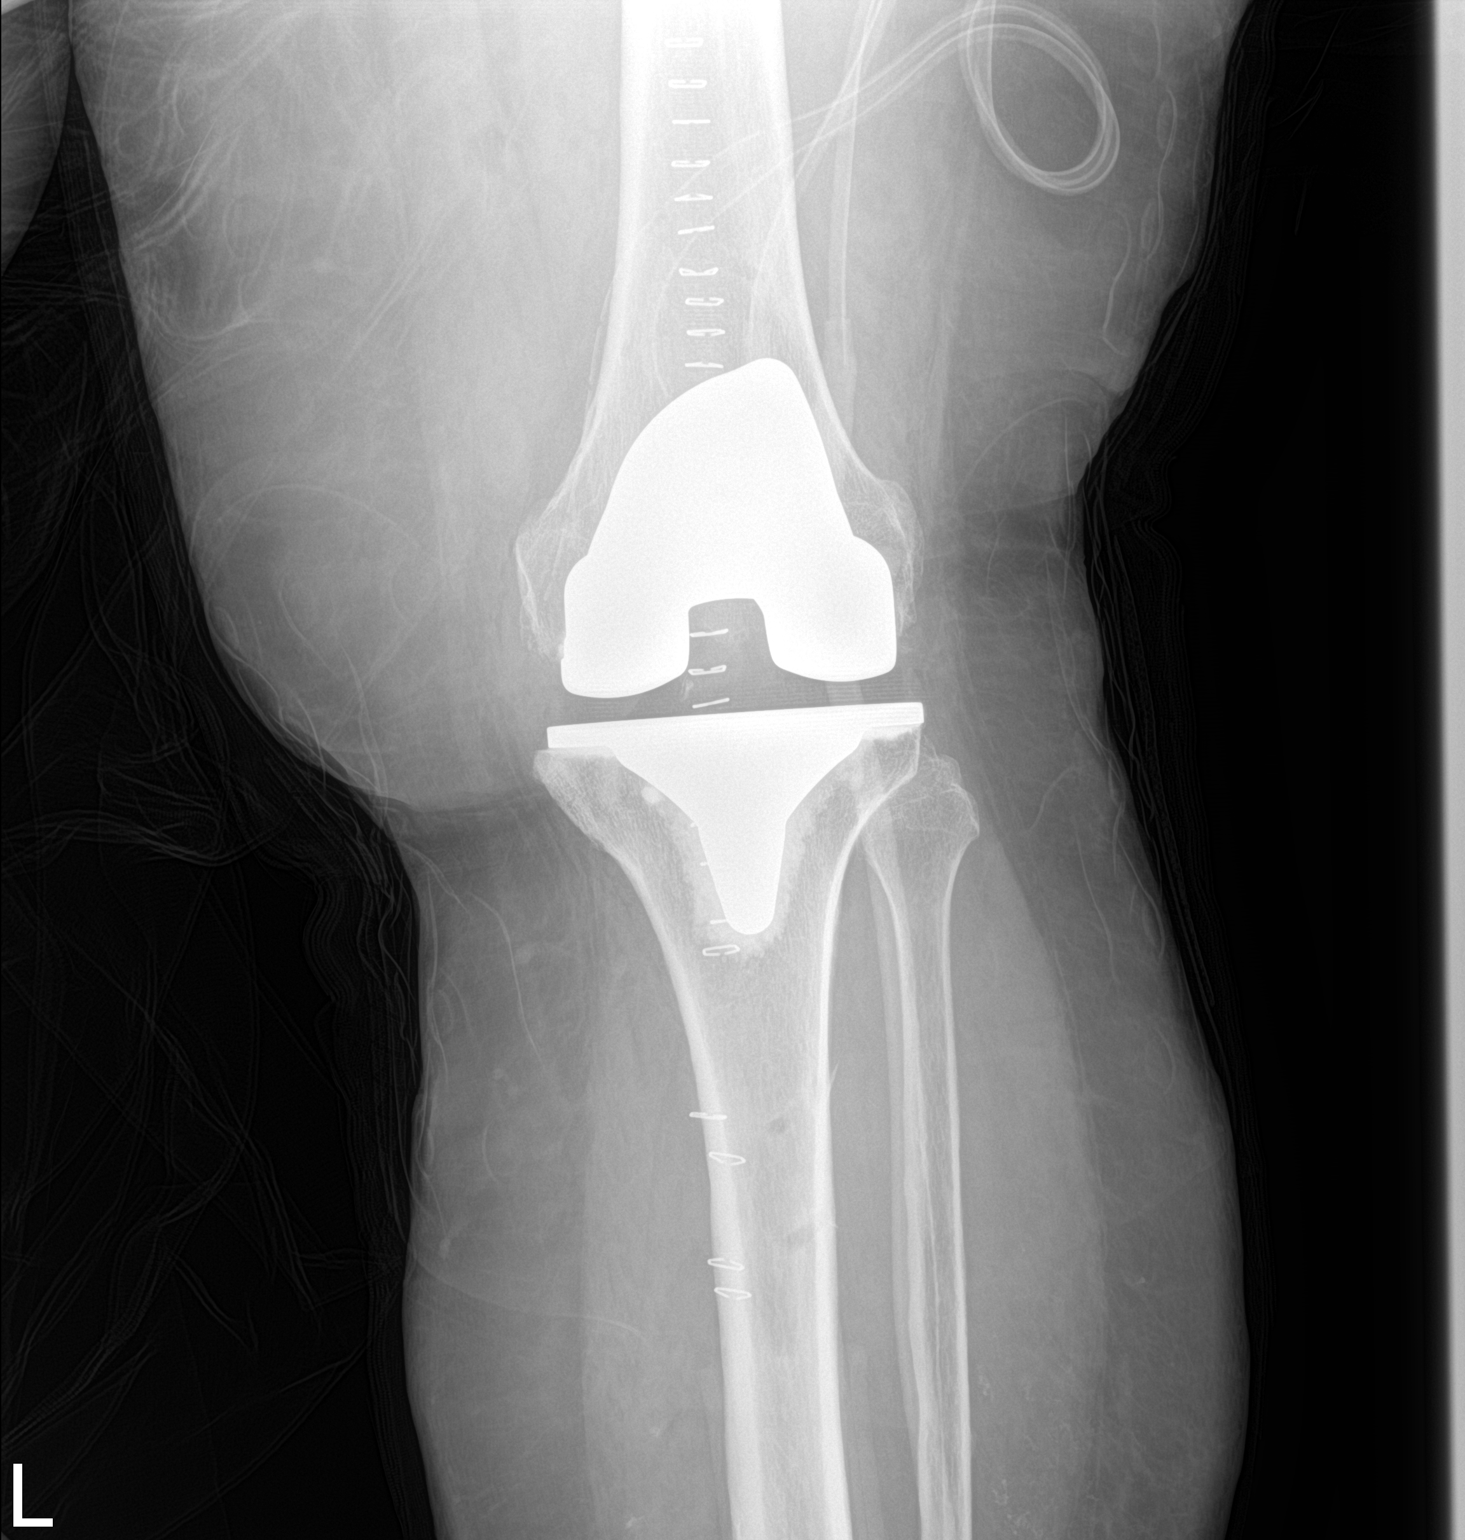

[knee lat]
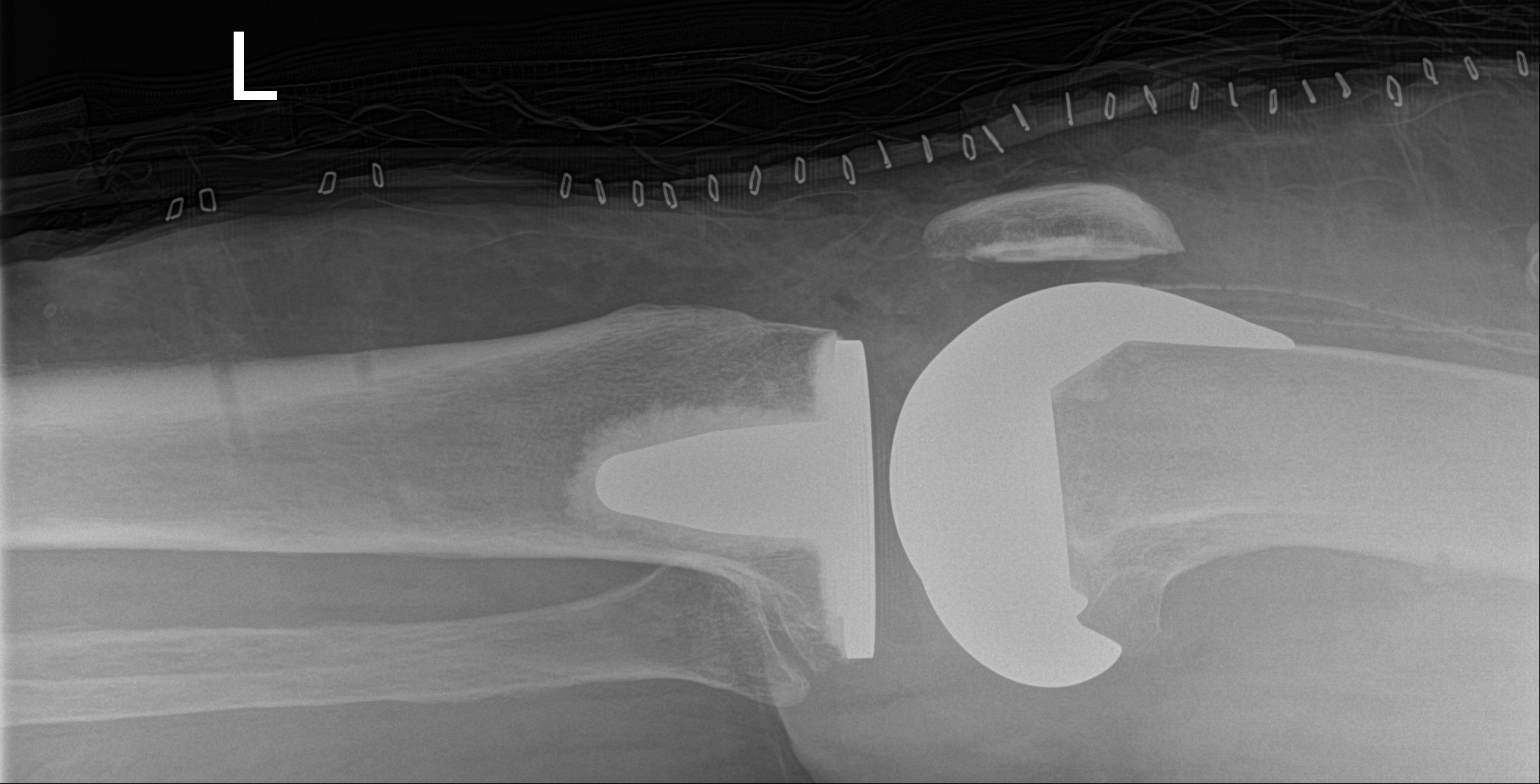

[2 of 2 positions shown; findings below may reference images not displayed]

FINDINGS: 3 components of left knee arthroplasty project in expected location.
No fracture or dislocation. Anterior surgical drain and skin
staples.
IMPRESSION: Left knee arthroplasty without apparent complication.

## 2021-02-05 ENCOUNTER — Encounter: Payer: Self-pay | Admitting: Emergency Medicine

## 2021-02-05 ENCOUNTER — Ambulatory Visit
Admission: EM | Admit: 2021-02-05 | Discharge: 2021-02-05 | Disposition: A | Discharge status: Home / Self Care | Payer: BC Managed Care – PPO | Attending: Emergency Medicine | Admitting: Emergency Medicine

## 2021-02-05 ENCOUNTER — Other Ambulatory Visit: Payer: Self-pay

## 2021-02-05 DIAGNOSIS — N390 Urinary tract infection, site not specified: Secondary | ICD-10-CM | POA: Diagnosis not present

## 2021-02-05 LAB — URINALYSIS, COMPLETE (UACMP) WITH MICROSCOPIC
Bilirubin Urine: NEGATIVE
Glucose, UA: >1000 mg/dL — AB
Ketones, ur: 40 mg/dL — AB
Nitrite: POSITIVE — AB
Protein, ur: 100 mg/dL — AB
Specific Gravity, Urine: 1.01 (ref 1.005–1.030)
WBC, UA: >50 WBC/hpf (ref 0–5)
pH: 6 (ref 5.0–8.0)

## 2021-02-05 MED ORDER — PHENAZOPYRIDINE HCL 200 MG PO TABS: 200.0000 mg | ORAL_TABLET | Freq: Three times a day (TID) | ORAL | 0 refills | Status: DC

## 2021-02-05 MED ORDER — NYSTATIN 100000 UNIT/GM EX CREA: TOPICAL_CREAM | CUTANEOUS | 0 refills | Status: DC

## 2021-02-05 MED ORDER — CEPHALEXIN 500 MG PO CAPS: 500.0000 mg | ORAL_CAPSULE | Freq: Two times a day (BID) | ORAL | 0 refills | Status: AC

## 2021-02-05 NOTE — ED Provider Notes (Signed)
[facility]    CSN: [id number] Arrival date & time: 02/05/21  1120      History   Chief Complaint Chief Complaint  Patient presents with   Dysuria   Rash    HPI Stephanie Mathews is a 60 y.o. female.   HPI  60 year old female here for evaluation of painful urination and burning in her genital area.  Patient reports that she has had burning with urination for the past 5 days with associated urgency and frequency of urination.  Her urine has been cloudy but she denies seeing any blood in her urine.  She states that her vulva is tender and painful when she urinates.  She states that it feels raised and hot.  She has had a fever with a T-max of 102.7.  There is also associated right-sided low back pain.   Past Medical History:  Diagnosis Date   Abnormal uterine bleeding    Arthritis    Asthma    Depression    Diabetes mellitus without complication ([place])    Eczema    History of kidney stones    Hyperlipidemia    Hypothyroidism    Kidney stones    Morbid obesity with BMI of 50.0-59.9, adult (HCC)    Myocardial infarction ([place])    Rosacea    Urinary incontinence, mixed     Patient Active Problem List   Diagnosis Date Noted   S/P total knee arthroplasty 04/27/2018   Venous stasis dermatitis of both lower extremities 01/13/2018   Lower limb ulcer, calf ([place]) 01/13/2018   Asthma without status asthmaticus 01/11/2018   Depression 01/11/2018   Eczema 01/11/2018   Hyperlipidemia 01/11/2018   Kidney stones 01/11/2018   Morbid obesity with body mass index (BMI) of 50.0 to 59.9 in adult ([place]) 01/11/2018   Diabetes mellitus type 2, uncomplicated ([place]) [id number]   Hx of adenomatous colonic polyps 09/12/2017   Primary osteoarthritis of left knee 03/18/2016   Breast calcification, left 03/16/2015   Vitamin D deficiency 01/24/2014    Past Surgical History:  Procedure Laterality Date   CESAREAN SECTION     COLONOSCOPY     COLONOSCOPY WITH PROPOFOL N/A  12/01/2017   Procedure: COLONOSCOPY WITH PROPOFOL;  Surgeon: Manya Silvas, MD;  Location: [facility] END[facility]COPY;  Service: Endoscopy;  Laterality: N/A;   Excision left foot     KNEE ARTHROPLASTY Left 04/27/2018   Procedure: COMPUTER ASSISTED TOTAL KNEE ARTHROPLASTY;  Surgeon: Dereck Leep, MD;  Location: [facility] ORS;  Service: Orthopedics;  Laterality: Left;   LITHOTRIPSY      OB History   No obstetric history on file.      Home Medications    Prior to Admission medications   Medication Sig Start Date End Date Taking? Authorizing Provider  cephALEXin (KEFLEX) 500 MG capsule Take 1 capsule (500 mg total) by mouth 2 (two) times daily for 5 days. 02/05/21 02/10/21 Yes Margarette Canada, NP  nystatin cream (MYCOSTATIN) Apply to affected area 2 times daily 02/05/21  Yes Margarette Canada, NP  phenazopyridine (PYRIDIUM) 200 MG tablet Take 1 tablet (200 mg total) by mouth 3 (three) times daily. 02/05/21  Yes Margarette Canada, NP  Calcium Carb-Cholecalciferol (CALCIUM 500 +D) 500-400 MG-UNIT TABS Take 1 tablet by mouth daily.    [provider]  carboxymethylcellulose (REFRESH TEARS) 0.5 % SOLN Place 1 drop into both eyes 4 (four) times daily as needed (dry eyes.).     [provider]  Cholecalciferol (VITAMIN D3) 5000 units TABS  Take 5,000 Units by mouth every evening.    [provider]  Chromium Picolinate 200 MCG TABS Take 200 mcg by mouth every evening.    [provider]  clotrimazole-betamethasone (LOTRISONE) cream Apply 1 application topically 2 (two) times daily as needed (for yeast on skin.).    [provider]  Cyanocobalamin (B-12) 5000 MCG SUBL Place 5,000 mcg under the tongue 2 (two) times daily.    [provider]  D-Mannose POWD Take 10 mLs by mouth 2 (two) times daily.     [provider]  dapagliflozin propanediol (FARXIGA) 10 MG TABS tablet Take 10 mg by mouth daily.    [provider]  enoxaparin (LOVENOX) 40 MG/0.4ML  injection Inject 0.4 mLs (40 mg total) into the skin daily for 14 days. 04/30/18 05/14/18  Watt Climes, PA  fluconazole (DIFLUCAN) 200 MG tablet Take 200 mg by mouth every Sunday. In the morning.    [provider]  insulin aspart (NOVOLOG FLEXPEN) 100 UNIT/ML FlexPen Inject 3-15 Units into the skin 4 (four) times daily - after meals and at bedtime.     [provider]  ivermectin (STROMECTOL) 3 MG TABS tablet Take 21 mg by mouth See admin instructions. Use 2-3 times a year (take 7 tablets (21 mg) by once a week, then repeat dose once after 1 week)    [provider]  levothyroxine (SYNTHROID, LEVOTHROID) 25 MCG tablet Take 25 mcg by mouth daily before breakfast. On an empty stomach    [provider]  loratadine (CLARITIN) 10 MG tablet Take 10 mg by mouth daily.    [provider]  losartan (COZAAR) 25 MG tablet Take 12.5 mg by mouth daily.     [provider]  Melatonin 5 MG TABS Take 5 mg by mouth at bedtime.    [provider]  metFORMIN (GLUCOPHAGE-XR) 500 MG 24 hr tablet Take 1,000 mg by mouth 2 (two) times daily.    [provider]  Misc Natural Products (OSTEO BI-FLEX JOINT SHIELD PO) Take 1 tablet by mouth every evening.    [provider]  Multiple Vitamin (MULTIVITAMIN WITH MINERALS) TABS tablet Take 1 tablet by mouth daily. Centrum silver    [provider]  mupirocin ointment (BACTROBAN) 2 % Place 1 application into the nose 2 (two) times daily.    [provider]  niacin (SLO-NIACIN) 500 MG tablet Take 250 mg by mouth daily with lunch.    [provider]  Polyethyl Glycol-Propyl Glycol 0.4-0.3 % SOLN Place 1 drop into both eyes 4 (four) times daily as needed (for dry/irritated eyes.). Systane    [provider]  Probiotic Product (DIGESTIVE ADVANTAGE PO) Take 1 capsule by mouth daily.     [provider]  pseudoephedrine (SUDAFED) 30 MG tablet Take 60 mg by mouth  daily as needed for congestion (for sinus congestion.).    [provider]  Turmeric POWD Take 5 mLs by mouth daily.     [provider]  Vitamin D, Ergocalciferol, (DRISDOL) 50000 units CAPS capsule Take 50,000 Units by mouth every 14 (fourteen) days.    [provider]  Zinc Oxide (DESITIN MAXIMUM STRENGTH EX) Apply 1 application topically daily as needed (wound care).    [provider]    Family History Family History  Problem Relation Age of Onset   Breast cancer Neg Hx     Social History Social History   Tobacco Use   Smoking status: Never  Smokeless tobacco: Never  Vaping Use   Vaping Use: Never used  Substance Use Topics   Alcohol use: Yes    Comment: rare   Drug use: Never     Allergies   Chlorhexidine, Actos [pioglitazone], Adhesive [tape], Ibuprofen, Januvia [sitagliptin], Lisinopril, Statins, Vicodin [hydrocodone-acetaminophen], Byetta 10 mcg pen [exenatide], Olive oil, and Peanut oil   Review of Systems Review of Systems  Constitutional:  Positive for fever. Negative for activity change and appetite change.  Genitourinary:  Positive for dysuria, frequency, urgency and vaginal pain. Negative for hematuria, vaginal bleeding and vaginal discharge.  Musculoskeletal:  Positive for back pain.    Physical Exam Triage Vital Signs ED Triage Vitals  Enc Vitals Group     BP 02/05/21 1220 128/81     Pulse Rate 02/05/21 1220 (!) 125     Resp 02/05/21 1220 20     Temp 02/05/21 1220 99.9 F (37.7 C)     Temp Source 02/05/21 1220 Oral     SpO2 02/05/21 1220 94 %     Weight --      Height --      Head Circumference --      Peak Flow --      Pain Score 02/05/21 1215 5     Pain Loc --      Pain Edu? --      Excl. in [place]? --    No data found.  Updated Vital Signs BP 128/81 (BP Location: Left Arm)   Pulse (!) 125   Temp 99.9 F (37.7 C) (Oral)   Resp 20   SpO2 94%   Visual Acuity Right Eye Distance:   Left Eye  Distance:   Bilateral Distance:    Right Eye Near:   Left Eye Near:    Bilateral Near:     Physical Exam Vitals and nursing note reviewed. Exam conducted with a chaperone present Lovena Le, CMA).  Constitutional:      Appearance: Normal appearance. She is obese.  HENT:     Head: Normocephalic and atraumatic.  Cardiovascular:     Rate and Rhythm: Normal rate and regular rhythm.     Pulses: Normal pulses.     Heart sounds: Normal heart sounds. No murmur heard.   No gallop.  Pulmonary:     Effort: Pulmonary effort is normal.     Breath sounds: Normal breath sounds. No wheezing, rhonchi or rales.  Abdominal:     Tenderness: There is no right CVA tenderness or left CVA tenderness.  Skin:    General: Skin is warm and dry.     Capillary Refill: Capillary refill takes less than 2 seconds.     Findings: Erythema present.  Neurological:     General: No focal deficit present.     Mental Status: She is alert and oriented to person, place, and time.  Psychiatric:        Mood and Affect: Mood normal.        Behavior: Behavior normal.        Thought Content: Thought content normal.        Judgment: Judgment normal.     UC Treatments / Results  Labs (all labs ordered are listed, but only abnormal results are displayed) Labs Reviewed  URINALYSIS, COMPLETE (UACMP) WITH MICROSCOPIC - Abnormal; Notable for the following components:      Result Value   APPearance HAZY (*)    Glucose, UA >1,000 (*)    Hgb urine dipstick LARGE (*)  Ketones, ur 40 (*)    Protein, ur 100 (*)    Nitrite POSITIVE (*)    Leukocytes,Ua SMALL (*)    Bacteria, UA MANY (*)    All other components within normal limits  URINE CULTURE    EKG   Radiology No results found.  Procedures Procedures (including critical care time)  Medications Ordered in UC Medications - No data to display  Initial Impression / Assessment and Plan / UC Course  I have reviewed the triage vital signs and the nursing  notes.  Pertinent labs & imaging results that were available during my care of the patient were reviewed by me and considered in my medical decision making (see chart for details).  Patient is an obese though nontoxic-appearing 60 year old female here for evaluation of urinary complaints as well as a burning of her vulva with urination that been going on for the past 5 days.  Patient reports that she had a fever yesterday with a T-max of 102.7 but has not had a fever today.  Patient's physical exam reveals benign cardiopulmonary exam with clear lung sounds in all fields.  Patient has no CVA tenderness.  Genitourinary exam was done with Leticia Clas, CMA as chaperone.  Patient's external vulva are pink in color but there is erythema on the inner aspects of both labia majora.  There is no discharge noted.  Urinalysis collected at triage.  Analysis shows hazy appearance,>1000 glucose, large hemoglobin, 40 ketones, 100 protein, positive nitrites, small leukocytes, 6-10 squamous epithelials and >50 WBCs.  Many bacteria and WBC clumps.  Will send urine for culture.  We will treat patient's UTI with Keflex 500 mg twice daily x5 days and will also provide Pyridium to help with urinary discomfort.  Patient is on Diflucan weekly as preventative for genitourinary fungal infections.  Will augment with nystatin ointment for treatment of her vulvar yeast infection.     Final Clinical Impressions(s) / UC Diagnoses   Final diagnoses:  Lower urinary tract infectious disease     Discharge Instructions      Take the Keflex twice daily for 5 days with food for treatment of urinary tract infection.  Use the Pyridium every 8 hours as needed for urinary discomfort.  This will turn your urine a bright red-orange.  Increase your oral fluid intake so that you increase your urine production and or flushing your urinary system.  Take an over-the-counter probiotic, such as Culturelle-Align-Activia, 1 hour after each  dose of antibiotic to prevent diarrhea or yeast infections from forming.  We will culture urine and change the antibiotics if necessary.  Apply the nystatin cream to your vulva twice daily for treatment of the yeast infection.  Return for reevaluation, or see your primary care provider, for any new or worsening symptoms.  If you continue to run fevers or have worsening of your vulvar pain please go to the emergency department for evaluation.     ED Prescriptions     Medication Sig Dispense Auth. Provider   cephALEXin (KEFLEX) 500 MG capsule Take 1 capsule (500 mg total) by mouth 2 (two) times daily for 5 days. 20 capsule Margarette Canada, NP   phenazopyridine (PYRIDIUM) 200 MG tablet Take 1 tablet (200 mg total) by mouth 3 (three) times daily. 6 tablet Margarette Canada, NP   nystatin cream (MYCOSTATIN) Apply to affected area 2 times daily 30 g Margarette Canada, NP      PDMP not reviewed this encounter.   Margarette Canada, NP 02/05/21 1312

## 2021-02-05 NOTE — Discharge Instructions (Addendum)
Take the Keflex twice daily for 5 days with food for treatment of urinary tract infection.  Use the Pyridium every 8 hours as needed for urinary discomfort.  This will turn your urine a bright red-orange.  Increase your oral fluid intake so that you increase your urine production and or flushing your urinary system.  Take an over-the-counter probiotic, such as Culturelle-Align-Activia, 1 hour after each dose of antibiotic to prevent diarrhea or yeast infections from forming.  We will culture urine and change the antibiotics if necessary.  Apply the nystatin cream to your vulva twice daily for treatment of the yeast infection.  Return for reevaluation, or see your primary care provider, for any new or worsening symptoms.  If you continue to run fevers or have worsening of your vulvar pain please go to the emergency department for evaluation.

## 2021-02-05 NOTE — ED Triage Notes (Signed)
Pt presents today with c/o of painful urination along with burning in folds of genital area x 5 days. Denies discharge.

## 2021-02-08 LAB — URINE CULTURE: Culture: >=100000 — AB

## 2021-03-19 ENCOUNTER — Ambulatory Visit (INDEPENDENT_AMBULATORY_CARE_PROVIDER_SITE_OTHER): Payer: BC Managed Care – PPO

## 2021-03-19 ENCOUNTER — Other Ambulatory Visit: Payer: Self-pay

## 2021-03-19 ENCOUNTER — Encounter: Payer: Self-pay | Admitting: Emergency Medicine

## 2021-03-19 ENCOUNTER — Ambulatory Visit
Admission: EM | Admit: 2021-03-19 | Discharge: 2021-03-19 | Disposition: A | Discharge status: Home / Self Care | Payer: BC Managed Care – PPO | Attending: Emergency Medicine | Admitting: Emergency Medicine

## 2021-03-19 DIAGNOSIS — N200 Calculus of kidney: Secondary | ICD-10-CM

## 2021-03-19 DIAGNOSIS — N3000 Acute cystitis without hematuria: Secondary | ICD-10-CM | POA: Diagnosis not present

## 2021-03-19 LAB — URINALYSIS, COMPLETE (UACMP) WITH MICROSCOPIC
Bilirubin Urine: NEGATIVE
Glucose, UA: >1000 mg/dL — AB
Ketones, ur: NEGATIVE mg/dL
Nitrite: POSITIVE — AB
Protein, ur: NEGATIVE mg/dL
RBC / HPF: >50 RBC/hpf (ref 0–5)
Specific Gravity, Urine: 1.01 (ref 1.005–1.030)
WBC, UA: >50 WBC/hpf (ref 0–5)
pH: 6 (ref 5.0–8.0)

## 2021-03-19 MED ORDER — PHENAZOPYRIDINE HCL 200 MG PO TABS: 200.0000 mg | ORAL_TABLET | Freq: Three times a day (TID) | ORAL | 0 refills | Status: DC

## 2021-03-19 MED ORDER — CEPHALEXIN 500 MG PO CAPS: 500.0000 mg | ORAL_CAPSULE | Freq: Two times a day (BID) | ORAL | 0 refills | Status: DC

## 2021-03-19 NOTE — ED Triage Notes (Signed)
Pt is present today with dysuria, urine frequency, and lower back pain. Pt states that her sx started Saturday

## 2021-03-19 NOTE — ED Provider Notes (Signed)
[facility]    CSN: [id number] Arrival date & time: 03/19/21  [id number]      History   Chief Complaint No chief complaint on file.   HPI Stephanie Mathews is a 60 y.o. female.   HPI  60 year old female here for evaluation of urinary complaints.  Patient reports that she has been experiencing painful urination with urinary urgency and frequency since July.  She was evaluated in this urgent care and placed on Keflex.  She reports that her symptoms improved but then returned after 1 to 2 days.  She has since been treated twice by her primary care provider with her most recent treatment ending approximately a week ago.  She states she is also having some left flank pain that feels like when she passes one of her small kidney stones.  She has not seen the blood in her urine, has not had any fever, denies abdominal pain, nausea, or vomiting.  She does state that her urine appeared cloudy today.  Past Medical History:  Diagnosis Date   Abnormal uterine bleeding    Arthritis    Asthma    Depression    Diabetes mellitus without complication ([place])    Eczema    History of kidney stones    Hyperlipidemia    Hypothyroidism    Kidney stones    Morbid obesity with BMI of 50.0-59.9, adult (HCC)    Myocardial infarction ([place])    Rosacea    Urinary incontinence, mixed     Patient Active Problem List   Diagnosis Date Noted   S/P total knee arthroplasty 04/27/2018   Venous stasis dermatitis of both lower extremities 01/13/2018   Lower limb ulcer, calf ([place]) 01/13/2018   Asthma without status asthmaticus 01/11/2018   Depression 01/11/2018   Eczema 01/11/2018   Hyperlipidemia 01/11/2018   Kidney stones 01/11/2018   Morbid obesity with body mass index (BMI) of 50.0 to 59.9 in adult ([place]) 01/11/2018   Diabetes mellitus type 2, uncomplicated ([place]) [id number]   Hx of adenomatous colonic polyps 09/12/2017   Primary osteoarthritis of left knee 03/18/2016   Breast calcification,  left 03/16/2015   Vitamin D deficiency 01/24/2014    Past Surgical History:  Procedure Laterality Date   CESAREAN SECTION     COLONOSCOPY     COLONOSCOPY WITH PROPOFOL N/A 12/01/2017   Procedure: COLONOSCOPY WITH PROPOFOL;  Surgeon: Manya Silvas, MD;  Location: [facility] ENDOSCOPY;  Service: Endoscopy;  Laterality: N/A;   Excision left foot     KNEE ARTHROPLASTY Left 04/27/2018   Procedure: COMPUTER ASSISTED TOTAL KNEE ARTHROPLASTY;  Surgeon: Dereck Leep, MD;  Location: [facility] ORS;  Service: Orthopedics;  Laterality: Left;   LITHOTRIPSY      OB History   No obstetric history on file.      Home Medications    Prior to Admission medications   Medication Sig Start Date End Date Taking? Authorizing Provider  cephALEXin (KEFLEX) 500 MG capsule Take 1 capsule (500 mg total) by mouth 2 (two) times daily for 10 days. 03/19/21 03/29/21 Yes Margarette Canada, NP  phenazopyridine (PYRIDIUM) 200 MG tablet Take 1 tablet (200 mg total) by mouth 3 (three) times daily. 03/19/21  Yes Margarette Canada, NP  Calcium Carb-Cholecalciferol (CALCIUM 500 +D) 500-400 MG-UNIT TABS Take 1 tablet by mouth daily.    [provider]  carboxymethylcellulose (REFRESH TEARS) 0.5 % SOLN Place 1 drop into both eyes 4 (four) times daily as needed (dry eyes.).     [provider]  Cholecalciferol (VITAMIN D3) 5000 units TABS Take 5,000 Units by mouth every evening.    [provider]  Chromium Picolinate 200 MCG TABS Take 200 mcg by mouth every evening.    [provider]  clotrimazole-betamethasone (LOTRISONE) cream Apply 1 application topically 2 (two) times daily as needed (for yeast on skin.).    [provider]  Cyanocobalamin (B-12) 5000 MCG SUBL Place 5,000 mcg under the tongue 2 (two) times daily.    [provider]  D-Mannose POWD Take 10 mLs by mouth 2 (two) times daily.     [provider]  dapagliflozin propanediol (FARXIGA) 10 MG TABS tablet Take 10 mg by  mouth daily.    [provider]  enoxaparin (LOVENOX) 40 MG/0.4ML injection Inject 0.4 mLs (40 mg total) into the skin daily for 14 days. 04/30/18 05/14/18  Watt Climes, PA  fluconazole (DIFLUCAN) 200 MG tablet Take 200 mg by mouth every Sunday. In the morning.    [provider]  insulin aspart (NOVOLOG FLEXPEN) 100 UNIT/ML FlexPen Inject 3-15 Units into the skin 4 (four) times daily - after meals and at bedtime.     [provider]  ivermectin (STROMECTOL) 3 MG TABS tablet Take 21 mg by mouth See admin instructions. Use 2-3 times a year (take 7 tablets (21 mg) by once a week, then repeat dose once after 1 week)    [provider]  levothyroxine (SYNTHROID, LEVOTHROID) 25 MCG tablet Take 25 mcg by mouth daily before breakfast. On an empty stomach    [provider]  loratadine (CLARITIN) 10 MG tablet Take 10 mg by mouth daily.    [provider]  losartan (COZAAR) 25 MG tablet Take 12.5 mg by mouth daily.     [provider]  Melatonin 5 MG TABS Take 5 mg by mouth at bedtime.    [provider]  metFORMIN (GLUCOPHAGE-XR) 500 MG 24 hr tablet Take 1,000 mg by mouth 2 (two) times daily.    [provider]  Misc Natural Products (OSTEO BI-FLEX JOINT SHIELD PO) Take 1 tablet by mouth every evening.    [provider]  Multiple Vitamin (MULTIVITAMIN WITH MINERALS) TABS tablet Take 1 tablet by mouth daily. Centrum silver    [provider]  mupirocin ointment (BACTROBAN) 2 % Place 1 application into the nose 2 (two) times daily.    [provider]  niacin (SLO-NIACIN) 500 MG tablet Take 250 mg by mouth daily with lunch.    [provider]  nystatin cream (MYCOSTATIN) Apply to affected area 2 times daily 02/05/21   Margarette Canada, NP  Polyethyl Glycol-Propyl Glycol 0.4-0.3 % SOLN Place 1 drop into both eyes 4 (four) times daily as needed (for dry/irritated eyes.). Systane    [provider]  Probiotic Product (DIGESTIVE ADVANTAGE PO) Take 1 capsule by mouth daily.     [provider]  pseudoephedrine (SUDAFED) 30 MG tablet Take 60 mg by mouth daily as needed for congestion (for sinus congestion.).    [provider]  Turmeric POWD Take 5 mLs by mouth daily.     [provider]  Vitamin D, Ergocalciferol, (DRISDOL) 50000 units CAPS capsule Take 50,000 Units by mouth every 14 (fourteen) days.    [provider]  Zinc Oxide (DESITIN MAXIMUM STRENGTH EX) Apply 1 application topically daily as needed (wound care).    [provider]    Family History Family History  Problem Relation  Age of Onset   Breast cancer Neg Hx     Social History Social History   Tobacco Use   Smoking status: Never   Smokeless tobacco: Never  Vaping Use   Vaping Use: Never used  Substance Use Topics   Alcohol use: Yes    Comment: rare   Drug use: Never     Allergies   Chlorhexidine, Actos [pioglitazone], Adhesive [tape], Ibuprofen, Januvia [sitagliptin], Lisinopril, Statins, Vicodin [hydrocodone-acetaminophen], Byetta 10 mcg pen [exenatide], Olive oil, and Peanut oil   Review of Systems Review of Systems  Constitutional:  Negative for activity change, appetite change and fever.  Gastrointestinal:  Negative for abdominal pain, diarrhea, nausea and vomiting.  Genitourinary:  Positive for dysuria, flank pain, frequency and urgency. Negative for hematuria.  Musculoskeletal:  Positive for back pain.  Skin:  Negative for rash.  Hematological: Negative.   Psychiatric/Behavioral: Negative.      Physical Exam Triage Vital Signs ED Triage Vitals  Enc Vitals Group     BP 03/19/21 1006 (!) 158/84     Pulse Rate 03/19/21 1006 (!) 129     Resp 03/19/21 1006 17     Temp 03/19/21 1006 98.9 F (37.2 C)     Temp src --      SpO2 03/19/21 1006 96 %     Weight --      Height --      Head Circumference --      Peak Flow --      Pain  Score 03/19/21 1005 3     Pain Loc --      Pain Edu? --      Excl. in [place]? --    No data found.  Updated Vital Signs BP (!) 158/84   Pulse (!) 129   Temp 98.9 F (37.2 C)   Resp 17   SpO2 96%   Visual Acuity Right Eye Distance:   Left Eye Distance:   Bilateral Distance:    Right Eye Near:   Left Eye Near:    Bilateral Near:     Physical Exam Vitals and nursing note reviewed.  Constitutional:      General: She is not in acute distress.    Appearance: Normal appearance. She is obese. She is not ill-appearing.  HENT:     Head: Normocephalic and atraumatic.  Cardiovascular:     Rate and Rhythm: Regular rhythm. Tachycardia present.     Pulses: Normal pulses.     Heart sounds: Normal heart sounds. No murmur heard.   No gallop.  Pulmonary:     Effort: Pulmonary effort is normal.     Breath sounds: Normal breath sounds. No wheezing, rhonchi or rales.  Abdominal:     Tenderness: There is no right CVA tenderness or left CVA tenderness.  Skin:    General: Skin is warm and dry.     Capillary Refill: Capillary refill takes less than 2 seconds.     Findings: No erythema or rash.  Neurological:     General: No focal deficit present.     Mental Status: She is alert and oriented to person, place, and time.  Psychiatric:        Mood and Affect: Mood normal.        Behavior: Behavior normal.        Thought Content: Thought content normal.        Judgment: Judgment normal.     UC Treatments / Results  Labs (all labs ordered are listed,  but only abnormal results are displayed) Labs Reviewed  URINALYSIS, COMPLETE (UACMP) WITH MICROSCOPIC - Abnormal; Notable for the following components:      Result Value   APPearance HAZY (*)    Glucose, UA >1,000 (*)    Hgb urine dipstick LARGE (*)    Nitrite POSITIVE (*)    Leukocytes,Ua SMALL (*)    Bacteria, UA MANY (*)    All other components within normal limits  URINE CULTURE    EKG   Radiology DG Abdomen 1 View  Result  Date: 03/19/2021 CLINICAL DATA:  Left flank pain, history of renal stones. EXAM: ABDOMEN - 1 VIEW COMPARISON:  CT abdomen pelvis dated 05/15/2010 and abdominal radiographs dated 05/05/2008. FINDINGS: The bowel gas pattern is normal. Stool overlies the rectum. A calculus overlying the left kidney measures 2.2 x 0.7 cm. No radiopaque calculus overlies the expected location of the right kidney. Three calcifications in the pelvis, with the largest measuring 5 mm, are unchanged since 05/05/2008 and are favored to reflect phleboliths. IMPRESSION: Calculus overlying the left kidney measuring 2.2 x 0.7 cm. Electronically Signed   By: Zerita Boers M.D.   On: 03/19/2021 10:48    Procedures Procedures (including critical care time)  Medications Ordered in UC Medications - No data to display  Initial Impression / Assessment and Plan / UC Course  I have reviewed the triage vital signs and the nursing notes.  Pertinent labs & imaging results that were available during my care of the patient were reviewed by me and considered in my medical decision making (see chart for details).  Patient is a nontoxic-appearing 60 year old female here for evaluation of urinary symptoms that have been present off and on since July.  At that time she was evaluated in this urgent care and placed on Keflex.  Her urinalysis grew out E. coli which was assisted to ampicillin and Unasyn but was sensitive to Keflex.  Patient's most recent UTI was on 02/27/2021 where E. coli again grew out of the culture.  The time she was placed on Macrobid twice daily for 7 days.  She reports that her symptoms have continued.  Patient is a diabetic and she is obese.  She is also on [place] which increases her susceptibility to UTIs.  Physical exam reveals patient who is nontoxic in appearance and not in any acute distress.  She is tachycardic with a heart rate of 129.  Patient has had an elevated heart rate each time she has been in the urgent care and she  states that she feels nervous.  She is afebrile.  Cardiopulmonary exam reveals tachycardic heart sounds that are free of murmur, gallop, or rub.  Lungs are clear to auscultation all fields.  Patient has no CVA tenderness on exam.  Patient reports that she has some left flank pain that feels like when she passes a kidney stone so will obtain KUB.  Urinalysis collected at triage.  Urinalysis shows hazy appearance, greater than 1000 glucose, large hemoglobin, nitrite positive, small leukocytes, greater than 50 WBCs, greater than 50 RBCs, many bacteria, and WBC clumps.  We will send urine for culture.  KUB impression by radiology is that there is a 2.2 x 0.7 cm calculus in the left kidney.  No other renal stones noted on exam.  Due to patient's recurrent UTIs, left flank pain, and the finding of a large stone in her left kidney we will place patient on Keflex twice daily for 10 days and refer her to urology  as the stone may be the source of infection recurrent infection in her urine.   Final Clinical Impressions(s) / UC Diagnoses   Final diagnoses:  Acute cystitis without hematuria  Renal stone     Discharge Instructions      The Keflex twice daily for 10 days for treatment of your UTI.  Use the Pyridium every 8 hours as needed for urinary discomfort.  Urine of bright red-orange.  Your x-ray today revealed the presence of a kidney stone in your left kidney which may be the source of your recurrent urinary tract infections.  I have referred you to urology for evaluation and management.     ED Prescriptions     Medication Sig Dispense Auth. Provider   cephALEXin (KEFLEX) 500 MG capsule Take 1 capsule (500 mg total) by mouth 2 (two) times daily for 10 days. 20 capsule Margarette Canada, NP   phenazopyridine (PYRIDIUM) 200 MG tablet Take 1 tablet (200 mg total) by mouth 3 (three) times daily. 6 tablet Margarette Canada, NP      PDMP not reviewed this encounter.   Margarette Canada, NP 03/19/21  [id number]

## 2021-03-19 NOTE — Discharge Instructions (Addendum)
The Keflex twice daily for 10 days for treatment of your UTI.  Use the Pyridium every 8 hours as needed for urinary discomfort.  Urine of bright red-orange.  Your x-ray today revealed the presence of a kidney stone in your left kidney which may be the source of your recurrent urinary tract infections.  I have referred you to urology for evaluation and management.

## 2021-03-20 DIAGNOSIS — U071 COVID-19: Secondary | ICD-10-CM

## 2021-03-20 HISTORY — DX: COVID-19: U07.1

## 2021-03-22 LAB — URINE CULTURE: Culture: >=100000 — AB

## 2021-03-27 ENCOUNTER — Other Ambulatory Visit: Payer: Self-pay

## 2021-03-27 ENCOUNTER — Encounter: Payer: Self-pay | Admitting: Urology

## 2021-03-27 ENCOUNTER — Ambulatory Visit: Payer: BC Managed Care – PPO | Admitting: Urology

## 2021-03-27 VITALS — BP 149/80 | HR 139 | Ht 63.0 in | Wt 283.0 lb

## 2021-03-27 DIAGNOSIS — N390 Urinary tract infection, site not specified: Secondary | ICD-10-CM

## 2021-03-27 DIAGNOSIS — N200 Calculus of kidney: Secondary | ICD-10-CM

## 2021-03-27 DIAGNOSIS — R1012 Left upper quadrant pain: Secondary | ICD-10-CM | POA: Diagnosis not present

## 2021-03-27 NOTE — Patient Instructions (Signed)
Laser Therapy for Kidney Stones Laser therapy for kidney stones is a procedure to break up small, hard mineral deposits that form in the kidney (kidney stones). The procedure is done using a device that produces a focused beam of light (laser). The laser breaks up kidney stones into pieces that are small enough to be passed out of the body through urination or removed from the body during the procedure. You may need laser therapy if you have kidney stones that are painful or block your urinary tract. This procedure is done by inserting a tube (ureteroscope) into your kidney through the urethral opening. The urethra is the part of the body that drains urine from the bladder. In women, the urethra opens above the vaginal opening. In men, the urethra opens at the tip of the penis. The ureteroscope is inserted through the urethra, and surgical instruments are moved through the bladder and the muscular tube that connects the kidney to the bladder (ureter) until they reach the kidney. Tell a health care provider about: Any allergies you have. All medicines you are taking, including vitamins, herbs, eye drops, creams, and over-the-counter medicines. Any problems you or family members have had with anesthetic medicines. Any blood disorders you have. Any surgeries you have had. Any medical conditions you have. Whether you are pregnant or may be pregnant. What are the risks? Generally, this is a safe procedure. However, problems may occur, including: Infection. Bleeding. Allergic reactions to medicines. Damage to the urethra, bladder, or ureter. Urinary tract infection (UTI). Narrowing of the urethra (urethral stricture). Difficulty passing urine. Blockage of the kidney caused by a fragment of kidney stone. What happens before the procedure? Medicines Ask your health care provider about: Changing or stopping your regular medicines. This is especially important if you are taking diabetes medicines or  blood thinners. Taking medicines such as aspirin and ibuprofen. These medicines can thin your blood. Do not take these medicines unless your health care provider tells you to take them. Taking over-the-counter medicines, vitamins, herbs, and supplements. Eating and drinking Follow instructions from your health care provider about eating and drinking, which may include: 8 hours before the procedure - stop eating heavy meals or foods, such as meat, fried foods, or fatty foods. 6 hours before the procedure - stop eating light meals or foods, such as toast or cereal. 6 hours before the procedure - stop drinking milk or drinks that contain milk. 2 hours before the procedure - stop drinking clear liquids. Staying hydrated Follow instructions from your health care provider about hydration, which may include: Up to 2 hours before the procedure - you may continue to drink clear liquids, such as water, clear fruit juice, black coffee, and plain tea.  General instructions You may have a physical exam before the procedure. You may also have tests, such as imaging tests and blood or urine tests. If your ureter is too narrow, your health care provider may place a soft, flexible tube (stent) inside of it. The stent may be placed days or weeks before your laser therapy procedure. Plan to have someone take you home from the hospital or clinic. If you will be going home right after the procedure, plan to have someone stay with you for 24 hours. Do not use any products that contain nicotine or tobacco for at least 4 weeks before the procedure. These products include cigarettes, e-cigarettes, and chewing tobacco. If you need help quitting, ask your health care provider. Ask your health care provider: How your   surgical site will be marked or identified. What steps will be taken to help prevent infection. These may include: Removing hair at the surgery site. Washing skin with a germ-killing soap. Taking antibiotic  medicine. What happens during the procedure?  An IV will be inserted into one of your veins. You will be given one or more of the following: A medicine to help you relax (sedative). A medicine to numb the area (local anesthetic). A medicine to make you fall asleep (general anesthetic). A ureteroscope will be inserted into your urethra. The ureteroscope will send images to a video screen in the operating room to guide your surgeon to the area of your kidney that will be treated. A small, flexible tube will be threaded through the ureteroscope and into your bladder and ureter, up to your kidney. The laser device will be inserted into your kidney through the tube. Your surgeon will pulse the laser on and off to break up kidney stones. A surgical instrument that has a tiny wire basket may be inserted through the tube into your kidney to remove the pieces of broken kidney stone. The procedure may vary among health care providers and hospitals. What happens after the procedure? Your blood pressure, heart rate, breathing rate, and blood oxygen level will be monitored until you leave the hospital or clinic. You will be given pain medicine as needed. You may continue to receive antibiotics. You may have a stent temporarily placed in your ureter. Do not drive for 24 hours if you were given a sedative during your procedure. You may be given a strainer to collect any stone fragments that you pass in your urine. Your health care provider may have these tested. Summary Laser therapy for kidney stones is a procedure to break up kidney stones into pieces that are small enough to be passed out of the body through urination or removed during the procedure. Follow instructions from your health care provider about eating and drinking before the procedure. During the procedure, the ureteroscope will send images to a video screen to guide your surgeon to the area of your kidney that will be treated. Do not drive  for 24 hours if you were given a sedative during your procedure. This information is not intended to replace advice given to you by your health care provider. Make sure you discuss any questions you have with your health care provider. Document Revised: 03/12/2018 Document Reviewed: 03/12/2018 Elsevier Patient Education  2022 [place].    Percutaneous Nephrolithotomy Percutaneous nephrolithotomy is a procedure to remove kidney stones. Kidney stones are deposits that form inside your kidneys and can cause pain. You may need this procedure if: You have large kidney stones. Kidney stones that are bigger than 2 cm (0.78 in.) wide may require this procedure. Your kidney stones are oddly shaped. Other treatments have not been successful in helping the kidney stones to pass. You have developed an infection due to the kidney stones. Tell a health care provider about: Any allergies you have. All medicines you are taking, including vitamins, herbs, eye drops, creams, and over-the-counter medicines. Any problems you or family members have had with anesthetic medicines. Any blood disorders you have. Any surgeries you have had. Any medical conditions you have. Whether you are pregnant or may be pregnant. Whether you use any tobacco products, including cigarettes, chewing tobacco, or e-cigarettes. What are the risks? Generally, this is a safe procedure. However, problems may occur, including: Infection. Bleeding. This may include blood in your urine.  Allergic reactions to medicines. Damage to other structures or organs. Kidney damage. Holes in the kidney. These often heal on their own. Numbness or tingling in the affected area. Inability to remove all the stones. You may need a different procedure to complete treatment. What happens before the procedure? Staying hydrated Follow instructions from your health care provider about hydration, which may include: Up to 2 hours before the procedure  - you may continue to drink clear liquids, such as water, clear fruit juice, black coffee, and plain tea.  Eating and drinking restrictions Follow instructions from your health care provider about eating and drinking, which may include: 8 hours before the procedure - stop eating heavy meals or foods, such as meat, fried foods, or fatty foods. 6 hours before the procedure - stop eating light meals or foods, such as toast or cereal. 6 hours before the procedure - stop drinking milk or drinks that contain milk. 2 hours before the procedure - stop drinking clear liquids. Medicines Ask your health care provider about: Changing or stopping your regular medicines. This is especially important if you are taking diabetes medicines or blood thinners. Taking medicines such as aspirin and ibuprofen. These medicines can thin your blood. Do not take these medicines unless your health care provider tells you to take them. Taking over-the-counter medicines, vitamins, herbs, and supplements. Tests You may have tests, including: Blood tests. Urine tests. Tests to check how your heart is working. Imaging studies. These are used to identify: The size and number (stone burden) of the kidney stones. The position of the kidney stones. General instructions Plan to have someone take you home from the hospital or clinic. Plan to have a responsible adult care for you for at least 24 hours after you leave the hospital or clinic. This is important. Ask your health care provider how your surgical site will be marked or identified. Ask your health care provider what steps will be taken to help prevent infection. These may include: Removing hair at the surgery site. Washing skin with a germ-killing soap. Taking antibiotic medicine. What happens during the procedure?  An IV will be inserted into one of your veins. The site of the procedure will be marked. You will be given one or more of the following: A medicine  to help you relax (sedative). A medicine to numb the area (local anesthetic). A medicine to make you fall asleep (general anesthetic). A medicine that is injected into your spine to numb the area below and slightly above the injection site (spinal anesthetic). A medicine that is injected into an area of your body to numb everything below the injection site (regional anesthetic). A thin tube (urinary catheter) will be put in your bladder to drain urine during and after the procedure. Your surgeon will make a small cut (incision) in your lower back. A tube will be inserted through the incision into your kidney. Each kidney stone will be removed through this tube. Larger stones may need to be broken up with a high-intensity light beam (laser) or other tools. After all of the stones have been removed, your health care provider may put in tubes to drain your bladder. Based on your condition: An internal tube, called a stent, may be put in your ureter. This will help drain urine from your kidney to your bladder. A surgical drain (nephrostomy tube) may be put in your kidney. The tube comes out through the incision in your lower back. This will help to drain urine or  any fluid that builds up while your kidney heals. Part of the incision may be closed with stitches (sutures). A bandage (dressing) will be placed over the incision area. The procedure may vary among health care providers and hospitals. What happens after the procedure? Your blood pressure, heart rate, breathing rate, and blood oxygen level will be monitored until you leave the hospital or clinic. You may be given medicine for pain. You will be shown how to do breathing exercises, such as coughing and breathing deeply. These will help to prevent pneumonia. You will be encouraged to walk. Walking helps to prevent blood clots. Your stent and urinary catheter will be removed after 1-2 days if there is only a small amount of blood in your  urine. You will be taught how to care for the catheter or nephrostomy tube, if you have them. Do not drive for 24 hours if you were given a sedative during your procedure. Summary Percutaneous nephrolithotomy is a procedure to remove kidney stones. Ask your health care provider about changing or stopping your regular medicines. Before surgery, follow instructions from your health care provider about eating and drinking. Plan to have someone take you home from the hospital or clinic. This information is not intended to replace advice given to you by your health care provider. Make sure you discuss any questions you have with your health care provider. Document Revised: 08/27/2018 Document Reviewed: 01/21/2018 Elsevier Patient Education  [place].

## 2021-03-28 ENCOUNTER — Other Ambulatory Visit: Payer: Self-pay | Admitting: Urology

## 2021-03-28 ENCOUNTER — Ambulatory Visit
Admission: RE | Admit: 2021-03-28 | Discharge: 2021-03-28 | Disposition: A | Discharge status: Home / Self Care | Payer: BC Managed Care – PPO | Source: Ambulatory Visit | Attending: Urology | Admitting: Urology

## 2021-03-28 ENCOUNTER — Other Ambulatory Visit: Payer: Self-pay

## 2021-03-28 ENCOUNTER — Telehealth: Payer: Self-pay | Admitting: Urology

## 2021-03-28 DIAGNOSIS — N201 Calculus of ureter: Secondary | ICD-10-CM

## 2021-03-28 DIAGNOSIS — R1012 Left upper quadrant pain: Secondary | ICD-10-CM | POA: Diagnosis present

## 2021-03-28 NOTE — Telephone Encounter (Signed)
Patient surgical orders entered. Sent info to PAT. No clearance needed.

## 2021-03-28 NOTE — Progress Notes (Addendum)
[facility] Posting Form   Surgery Date/Time: 04/20/2021  Surgeon: Dr. Nickolas Madrid  Surgery Location: Day Surgery  Inpt ( No  )   Outpt (Yes)   Obs ( No  )   Diagnosis: N20.1 Left Ureteral Stone  -CPT: [phone]   Surgery: CYSTO/URS/LL/Stent  Stop Anticoagulations: No  Cardiac/Medical/Pulmonary Clearance needed: No  *Orders entered into EPIC  Date: 03/28/21   *Case booked in EPIC  Date: 03/28/21  *Notified pt of Surgery: Date: 03/28/21  PRE-OP UA & CX: No  *Placed into Prior Authorization Work [place] Date: 03/28/21   Assistant/laser/rep:No

## 2021-03-28 NOTE — H&P (View-Only) (Signed)
   03/28/21 7:59 AM   Stephanie Mathews 1960/09/14 [id number]  CC: Nephrolithiasis, recurrent UTI  HPI: Comorbid 60 year old female with morbid obesity and BMI of 50, recent COVID infection, and recurrent UTIs over the last few months.  She was evaluated in urgent care and there was a 2 cm calcification over the left kidney on KUB, urinalysis grew E. coli, and she was discharged with antibiotics.  Her UTI symptoms are burning and urgency/frequency, and she has had some intermittent left flank pain.  She denies any fevers over the last week since being diagnosed with COVID last week.  She has had recurrent nephrolithiasis in the past, has never required surgery.  She reportedly has had uric acid stones in the past.   PMH: Past Medical History:  Diagnosis Date   Abnormal uterine bleeding    Arthritis    Asthma    Depression    Diabetes mellitus without complication (HCC)    Eczema    History of kidney stones    Hyperlipidemia    Hypothyroidism    Kidney stones    Morbid obesity with BMI of 50.0-59.9, adult (HCC)    Myocardial infarction ([place])    Rosacea    Urinary incontinence, mixed     Surgical History: Past Surgical History:  Procedure Laterality Date   CESAREAN SECTION     COLONOSCOPY     COLONOSCOPY WITH PROPOFOL N/A 12/01/2017   Procedure: COLONOSCOPY WITH PROPOFOL;  Surgeon: Manya Silvas, MD;  Location: [facility] ENDOSCOPY;  Service: Endoscopy;  Laterality: N/A;   Excision left foot     KNEE ARTHROPLASTY Left 04/27/2018   Procedure: COMPUTER ASSISTED TOTAL KNEE ARTHROPLASTY;  Surgeon: Dereck Leep, MD;  Location: [facility] ORS;  Service: Orthopedics;  Laterality: Left;   LITHOTRIPSY      Family History: Family History  Problem Relation Age of Onset   Breast cancer Neg Hx     Social History:  reports that she has never smoked. She has never used smokeless tobacco. She reports current alcohol use. She reports that she does not use drugs.  Physical Exam: BP (!)  149/80   Pulse (!) 139   Ht '5\' 3"'$  (1.6 m)   Wt 283 lb (128.4 kg)   BMI 50.13 kg/m    Constitutional:  Alert and oriented, No acute distress. Cardiovascular: No clubbing, cyanosis, or edema. Respiratory: Normal respiratory effort, no increased work of breathing. GI: Abdomen is soft, nontender, nondistended, no abdominal masses  Laboratory Data: Reviewed, see HPI  Pertinent Imaging: I have personally viewed and interpreted the KUB showing a 2 cm calcification over the left renal shadow.  Assessment & Plan:   60 year old female with recurrent UTIs and intermittent left flank pain likely secondary to a left large renal pelvis stone measuring about 2 cm on KUB.  We discussed the need for further evaluation with CT, and likely need for ureteroscopy, possible staged ureteroscopy, possible left PCNL pending CT findings.  She continues on Keflex and has no acute signs today of acute infection/obstruction.  Return precautions discussed extensively.  Will call with CT results tomorrow, anticipate ureteroscopy versus PCNL in the next 1 to 2 weeks.  Would be much higher risk for PCNL with her recent COVID infection and morbid obesity, so staged ureteroscopy may be a better option even if stone is > 2 cm   Nickolas Madrid, MD 03/28/2021  [facility] [address] [place], [place] [address] [phone]

## 2021-03-28 NOTE — Telephone Encounter (Signed)
Per Dr.Sninsky Patient is to be scheduled for Left Ureteroscopy,laser Lithotripsy, Ureteral Stent Placement  Stephanie Mathews was contacted and possible surgical dates were discussed, 03/30/21 was agreed upon for surgery.   Patient was directed to call [phone] between 1-3pm the day before surgery to find out surgical arrival time.  Instructions were given not to eat or drink from midnight on the night before surgery and have a driver for the day of surgery. On the surgery day patient was instructed to enter through the [place] entrance of [facility] report the Same Day Surgery desk.   Pre-Admit Testing will be in contact via phone to set up an interview with the anesthesia team to review your history and medications prior to surgery.   Reminder of this information was sent via mychart to the patient.   Patient is not to hold anticoag's or ASA. per Dr. Diamantina Providence.

## 2021-03-28 NOTE — Addendum Note (Signed)
Addended by: Gerald Leitz A on: 03/28/2021 03:22 PM   Modules accepted: Orders, SmartSet

## 2021-03-28 NOTE — Progress Notes (Signed)
   03/28/21 7:59 AM   Joanette Gula 1961/06/12 [id number]  CC: Nephrolithiasis, recurrent UTI  HPI: Comorbid 60 year old female with morbid obesity and BMI of 50, recent COVID infection, and recurrent UTIs over the last few months.  She was evaluated in urgent care and there was a 2 cm calcification over the left kidney on KUB, urinalysis grew E. coli, and she was discharged with antibiotics.  Her UTI symptoms are burning and urgency/frequency, and she has had some intermittent left flank pain.  She denies any fevers over the last week since being diagnosed with COVID last week.  She has had recurrent nephrolithiasis in the past, has never required surgery.  She reportedly has had uric acid stones in the past.   PMH: Past Medical History:  Diagnosis Date   Abnormal uterine bleeding    Arthritis    Asthma    Depression    Diabetes mellitus without complication (HCC)    Eczema    History of kidney stones    Hyperlipidemia    Hypothyroidism    Kidney stones    Morbid obesity with BMI of 50.0-59.9, adult (HCC)    Myocardial infarction ([place])    Rosacea    Urinary incontinence, mixed     Surgical History: Past Surgical History:  Procedure Laterality Date   CESAREAN SECTION     COLONOSCOPY     COLONOSCOPY WITH PROPOFOL N/A 12/01/2017   Procedure: COLONOSCOPY WITH PROPOFOL;  Surgeon: Manya Silvas, MD;  Location: [facility] ENDOSCOPY;  Service: Endoscopy;  Laterality: N/A;   Excision left foot     KNEE ARTHROPLASTY Left 04/27/2018   Procedure: COMPUTER ASSISTED TOTAL KNEE ARTHROPLASTY;  Surgeon: Dereck Leep, MD;  Location: [facility] ORS;  Service: Orthopedics;  Laterality: Left;   LITHOTRIPSY      Family History: Family History  Problem Relation Age of Onset   Breast cancer Neg Hx     Social History:  reports that she has never smoked. She has never used smokeless tobacco. She reports current alcohol use. She reports that she does not use drugs.  Physical Exam: BP (!)  149/80   Pulse (!) 139   Ht '5\' 3"'$  (1.6 m)   Wt 283 lb (128.4 kg)   BMI 50.13 kg/m    Constitutional:  Alert and oriented, No acute distress. Cardiovascular: No clubbing, cyanosis, or edema. Respiratory: Normal respiratory effort, no increased work of breathing. GI: Abdomen is soft, nontender, nondistended, no abdominal masses  Laboratory Data: Reviewed, see HPI  Pertinent Imaging: I have personally viewed and interpreted the KUB showing a 2 cm calcification over the left renal shadow.  Assessment & Plan:   60 year old female with recurrent UTIs and intermittent left flank pain likely secondary to a left large renal pelvis stone measuring about 2 cm on KUB.  We discussed the need for further evaluation with CT, and likely need for ureteroscopy, possible staged ureteroscopy, possible left PCNL pending CT findings.  She continues on Keflex and has no acute signs today of acute infection/obstruction.  Return precautions discussed extensively.  Will call with CT results tomorrow, anticipate ureteroscopy versus PCNL in the next 1 to 2 weeks.  Would be much higher risk for PCNL with her recent COVID infection and morbid obesity, so staged ureteroscopy may be a better option even if stone is > 2 cm   Nickolas Madrid, MD 03/28/2021  [facility] [address], [place], [place] [address] [phone]

## 2021-03-28 NOTE — Progress Notes (Signed)
Surgical Physician Order Form  ** Scheduling expectation  Friday 9/16 (preferably in AM)  *Length of Case: 1 hour  *Clearance needed: no  *Anticoagulation Instructions: Okay to continue  *Aspirin Instructions: Okay to continue  *Post-op visit Date/Instructions: TBD  *Diagnosis: Left ureteral stone  *Procedure: Left ureteroscopy, laser lithotripsy, stent placement  -Admit type: Outpatient  Yes Other: ___  -Anesthesia: general  -VTE Prophylaxis Standing Order SCD's       Other: __________  -Standing Lab Orders Per Anesthesia    Lab other: _______   UA&Urine Culture [No]   -Standing Test orders EKG/Chest x-ray per Anesthesia       Test other: ______   - Medications:       Ancef 2g IV   Gentamicin __ mg IV or per pharmacy '[]'$     Gemcitabine '2000mg'$  bladder instillation '[]'$      Other:__________

## 2021-03-29 ENCOUNTER — Telehealth: Payer: Self-pay

## 2021-03-29 ENCOUNTER — Encounter
Admission: RE | Admit: 2021-03-29 | Discharge: 2021-03-29 | Disposition: A | Discharge status: Home / Self Care | Payer: BC Managed Care – PPO | Source: Ambulatory Visit | Attending: Urology | Admitting: Urology

## 2021-03-29 DIAGNOSIS — N390 Urinary tract infection, site not specified: Secondary | ICD-10-CM

## 2021-03-29 DIAGNOSIS — N200 Calculus of kidney: Secondary | ICD-10-CM

## 2021-03-29 HISTORY — DX: Claustrophobia: F40.240

## 2021-03-29 HISTORY — DX: Benign neoplasm of colon, unspecified: D12.6

## 2021-03-29 HISTORY — DX: Essential (primary) hypertension: I10

## 2021-03-29 HISTORY — DX: Headache, unspecified: R51.9

## 2021-03-29 HISTORY — DX: Unspecified hemorrhoids: K64.9

## 2021-03-29 HISTORY — DX: Vitamin D deficiency, unspecified: E55.9

## 2021-03-29 HISTORY — DX: Chronic kidney disease, unspecified: N18.9

## 2021-03-29 MED ORDER — CEPHALEXIN 500 MG PO CAPS: 500.0000 mg | ORAL_CAPSULE | Freq: Every day | ORAL | 0 refills | Status: DC

## 2021-03-29 NOTE — Pre-Procedure Instructions (Signed)
During anesthesia phone interview for upcoming surgery on 03-30-21 pt states she home tested positive for covid on 9-6 and pt is still having yellow productive cough and continued to cough thru entire interview saying that she coughs all day and coughs up a lot of phlegm. Leah at Dr Willene Hatchet office notified of this and will relay info to Dr Diamantina Providence

## 2021-03-29 NOTE — Telephone Encounter (Signed)
Pt has been cancelled for surgery, does she still need to be on antibiotics?

## 2021-03-29 NOTE — Telephone Encounter (Signed)
Called patient to inform her about pre-admit testing appointment. Patient states that Dr. Diamantina Providence told her he would send her in additional doses of Keflex to [facility]. She states that the pharmacy has not received this. Patient would like someone to call her once Rx has been sent in.

## 2021-03-29 NOTE — Progress Notes (Signed)
Pt is allowed to wear her own TED hose for surgery.

## 2021-03-29 NOTE — Telephone Encounter (Signed)
RX sent

## 2021-03-29 NOTE — Telephone Encounter (Signed)
Would do daily keflex prophylaxis '500mg'$  daily x 3 weeks until she can get cleared for surgery  Nickolas Madrid, MD 03/29/2021

## 2021-03-29 NOTE — Patient Instructions (Signed)
Your procedure is scheduled on:03-30-21 Friday Report to the Registration Desk on the 1st floor of the [facility]. Then proceed to the 2nd floor Surgery Desk in the [facility] To find out your arrival time, please call [phone] between 1PM - 3PM on:03-29-21 Thursday  REMEMBER: Instructions that are not followed completely may result in serious medical risk, up to and including death; or upon the discretion of your surgeon and anesthesiologist your surgery may need to be rescheduled.  Do not eat food OR drink any liquids after midnight the night before surgery.  No gum chewing, lozengers or hard candies.  TAKE THESE MEDICATIONS THE MORNING OF SURGERY WITH A SIP OF WATER: -Pt unable to take her thyroid and claritin the day of surgery due to needing a lot of water to get pills down  Pt instructed to stop Metformin and Farxiga NOW (03-29-21)-Last dose of Wilder Glade was today am 03-29-21 and last dose of Metformin was am dose 03-29-21  Take half of your insulin tonight Tyler Aas) and NO insulin the morning of surgery  One week prior to surgery: Stop Anti-inflammatories (NSAIDS) such as Advil, Aleve, Ibuprofen, Motrin, Naproxen, Naprosyn and Aspirin based products such as Excedrin, Goodys Powder, BC Powder.You may however, continue to take Tylenol if needed for pain up until the day of surgery.  Stop ANY OVER THE COUNTER supplements/vitamins NOW (03-29-21) until after surgery.  No Alcohol for 24 hours before or after surgery.  No Smoking including e-cigarettes for 24 hours prior to surgery.  No chewable tobacco products for at least 6 hours prior to surgery.  No nicotine patches on the day of surgery.  Do not use any "recreational" drugs for at least a week prior to your surgery.  Please be advised that the combination of cocaine and anesthesia may have negative outcomes, up to and including death. If you test positive for cocaine, your surgery will be cancelled.  On the morning of surgery  brush your teeth with toothpaste and water, you may rinse your mouth with mouthwash if you wish. Do not swallow any toothpaste or mouthwash.  Do not wear jewelry, make-up, hairpins, clips or nail polish.  Do not wear lotions, powders, or perfumes.   Do not shave body from the neck down 48 hours prior to surgery just in case you cut yourself which could leave a site for infection.  Also, freshly shaved skin may become irritated if using the CHG soap.  Contact lenses, hearing aids and dentures may not be worn into surgery.  Do not bring valuables to the hospital. [facility] is not responsible for any missing/lost belongings or valuables.   Notify your doctor if there is any change in your medical condition (cold, fever, infection).  Wear comfortable clothing (specific to your surgery type) to the hospital.  After surgery, you can help prevent lung complications by doing breathing exercises.  Take deep breaths and cough every 1-2 hours. Your doctor may order a device called an Incentive Spirometer to help you take deep breaths. When coughing or sneezing, hold a pillow firmly against your incision with both hands. This is called "splinting." Doing this helps protect your incision. It also decreases belly discomfort.  If you are being admitted to the hospital overnight, leave your suitcase in the car. After surgery it may be brought to your room.  If you are being discharged the day of surgery, you will not be allowed to drive home. You will need a responsible adult (18 years or older)  to drive you home and stay with you that night.   If you are taking public transportation, you will need to have a responsible adult (18 years or older) with you. Please confirm with your physician that it is acceptable to use public transportation.   Please call the [facility] at [phone] if you have any questions about these instructions.  Surgery Visitation Policy:  Patients  undergoing a surgery or procedure may have one family member or support person with them as long as that person is not COVID-19 positive or experiencing its symptoms.  That person may remain in the waiting area during the procedure.  Inpatient Visitation:    Visiting hours are 7 a.m. to 8 p.m. Inpatients will be allowed two visitors daily. The visitors may change each day during the patient's stay. No visitors under the age of 19. Any visitor under the age of 55 must be accompanied by an adult. The visitor must pass COVID-19 screenings, use hand sanitizer when entering and exiting the patient's room and wear a mask at all times, including in the patient's room. Patients must also wear a mask when staff or their visitor are in the room. Masking is required regardless of vaccination status.

## 2021-04-02 NOTE — Addendum Note (Signed)
Addended by: Gerald Leitz A on: 04/02/2021 09:36 AM   Modules accepted: Orders

## 2021-04-10 ENCOUNTER — Other Ambulatory Visit
Admission: RE | Admit: 2021-04-10 | Discharge: 2021-04-10 | Disposition: A | Discharge status: Home / Self Care | Payer: BC Managed Care – PPO | Attending: Urology | Admitting: Urology

## 2021-04-10 ENCOUNTER — Other Ambulatory Visit: Payer: Self-pay

## 2021-04-10 DIAGNOSIS — N201 Calculus of ureter: Secondary | ICD-10-CM | POA: Insufficient documentation

## 2021-04-10 LAB — URINALYSIS, COMPLETE (UACMP) WITH MICROSCOPIC
Bilirubin Urine: NEGATIVE
Glucose, UA: 500 mg/dL — AB
Ketones, ur: NEGATIVE mg/dL
Nitrite: NEGATIVE
Protein, ur: NEGATIVE mg/dL
RBC / HPF: >50 RBC/hpf (ref 0–5)
Specific Gravity, Urine: 1.015 (ref 1.005–1.030)
pH: 6.5 (ref 5.0–8.0)

## 2021-04-11 LAB — URINE CULTURE: Culture: <10000 — AB

## 2021-04-13 ENCOUNTER — Telehealth: Payer: Self-pay | Admitting: *Deleted

## 2021-04-13 NOTE — Telephone Encounter (Signed)
Pt having surgery on 04/20/2021 and she looked at the CT and she noticed it said "Reproductive: The uterus is surgically absent. There is no adnexal mass." Pt states she has not had a hysterectomy. Pt is very nervous and would like to discuss.

## 2021-04-13 NOTE — Telephone Encounter (Signed)
I reviewed her CT and uterus is present. The report from radiology is mistaken, I will reach out to them to have them fix it, thanks  Nickolas Madrid, MD 04/13/2021

## 2021-04-16 NOTE — Telephone Encounter (Signed)
Called pt informed her of the information below. Pt gave verbal understanding.  

## 2021-04-20 ENCOUNTER — Ambulatory Visit: Payer: BC Managed Care – PPO | Admitting: Urgent Care

## 2021-04-20 ENCOUNTER — Ambulatory Visit: Payer: BC Managed Care – PPO | Admitting: Anesthesiology

## 2021-04-20 ENCOUNTER — Encounter
Admission: RE | Disposition: A | Discharge status: Home / Self Care | Payer: Self-pay | Source: Home / Self Care | Attending: Urology

## 2021-04-20 ENCOUNTER — Ambulatory Visit: Payer: BC Managed Care – PPO

## 2021-04-20 ENCOUNTER — Encounter: Payer: Self-pay | Admitting: Urology

## 2021-04-20 ENCOUNTER — Other Ambulatory Visit: Payer: Self-pay

## 2021-04-20 ENCOUNTER — Ambulatory Visit
Admission: RE | Admit: 2021-04-20 | Discharge: 2021-04-20 | Disposition: A | Discharge status: Home / Self Care | Payer: BC Managed Care – PPO | Attending: Urology | Admitting: Urology

## 2021-04-20 DIAGNOSIS — Z6841 Body Mass Index (BMI) 40.0 and over, adult: Secondary | ICD-10-CM | POA: Insufficient documentation

## 2021-04-20 DIAGNOSIS — Z87440 Personal history of urinary (tract) infections: Secondary | ICD-10-CM | POA: Insufficient documentation

## 2021-04-20 DIAGNOSIS — N200 Calculus of kidney: Secondary | ICD-10-CM | POA: Diagnosis not present

## 2021-04-20 DIAGNOSIS — N201 Calculus of ureter: Secondary | ICD-10-CM | POA: Diagnosis not present

## 2021-04-20 DIAGNOSIS — Z8616 Personal history of COVID-19: Secondary | ICD-10-CM | POA: Diagnosis not present

## 2021-04-20 DIAGNOSIS — Z87442 Personal history of urinary calculi: Secondary | ICD-10-CM | POA: Insufficient documentation

## 2021-04-20 DIAGNOSIS — N183 Chronic kidney disease, stage 3 unspecified: Secondary | ICD-10-CM | POA: Insufficient documentation

## 2021-04-20 DIAGNOSIS — E1122 Type 2 diabetes mellitus with diabetic chronic kidney disease: Secondary | ICD-10-CM | POA: Diagnosis not present

## 2021-04-20 DIAGNOSIS — I129 Hypertensive chronic kidney disease with stage 1 through stage 4 chronic kidney disease, or unspecified chronic kidney disease: Secondary | ICD-10-CM | POA: Diagnosis not present

## 2021-04-20 HISTORY — PX: CYSTOSCOPY/URETEROSCOPY/HOLMIUM LASER/STENT PLACEMENT: SHX6546

## 2021-04-20 HISTORY — PX: CYSTOSCOPY W/ RETROGRADES: SHX1426

## 2021-04-20 LAB — GLUCOSE, CAPILLARY
Glucose-Capillary: 143 mg/dL — ABNORMAL HIGH (ref 70–99)
Glucose-Capillary: 137 mg/dL — ABNORMAL HIGH (ref 70–99)

## 2021-04-20 SURGERY — CYSTOSCOPY/URETEROSCOPY/HOLMIUM LASER/STENT PLACEMENT
Anesthesia: General | Laterality: Left

## 2021-04-20 MED ORDER — CEFAZOLIN SODIUM-DEXTROSE 2-4 GM/100ML-% IV SOLN
2.0000 g | INTRAVENOUS | Status: AC
  Administered 2021-04-20: 1 g via INTRAVENOUS
  Administered 2021-04-20: 2 g via INTRAVENOUS

## 2021-04-20 MED ORDER — SODIUM CHLORIDE 0.9 % IV SOLN: INTRAVENOUS | Status: DC

## 2021-04-20 MED ORDER — FAMOTIDINE 20 MG PO TABS
ORAL_TABLET | ORAL | Status: AC
  Administered 2021-04-20: 20 mg via ORAL
  Filled 2021-04-20: qty 1

## 2021-04-20 MED ORDER — LIDOCAINE HCL (CARDIAC) PF 100 MG/5ML IV SOSY
PREFILLED_SYRINGE | INTRAVENOUS | Status: DC | PRN
  Administered 2021-04-20: 100 mg via INTRAVENOUS

## 2021-04-20 MED ORDER — SUGAMMADEX SODIUM 500 MG/5ML IV SOLN
INTRAVENOUS | Status: AC
  Filled 2021-04-20: qty 5

## 2021-04-20 MED ORDER — FENTANYL CITRATE (PF) 100 MCG/2ML IJ SOLN
INTRAMUSCULAR | Status: AC
  Filled 2021-04-20: qty 2

## 2021-04-20 MED ORDER — MIDAZOLAM HCL 2 MG/2ML IJ SOLN
INTRAMUSCULAR | Status: DC | PRN
  Administered 2021-04-20: 2 mg via INTRAVENOUS

## 2021-04-20 MED ORDER — MIDAZOLAM HCL 2 MG/2ML IJ SOLN
INTRAMUSCULAR | Status: AC
  Filled 2021-04-20: qty 2

## 2021-04-20 MED ORDER — OXYCODONE HCL 5 MG/5ML PO SOLN
5.0000 mg | Freq: Once | ORAL | Status: DC | PRN
Start: 2021-04-20 — End: 2021-04-20

## 2021-04-20 MED ORDER — SUGAMMADEX SODIUM 500 MG/5ML IV SOLN
INTRAVENOUS | Status: DC | PRN
  Administered 2021-04-20: 500 mg via INTRAVENOUS

## 2021-04-20 MED ORDER — CEFAZOLIN SODIUM 1 G IJ SOLR
INTRAMUSCULAR | Status: AC
  Filled 2021-04-20: qty 10

## 2021-04-20 MED ORDER — FENTANYL CITRATE (PF) 100 MCG/2ML IJ SOLN: 25.0000 ug | INTRAMUSCULAR | Status: DC | PRN

## 2021-04-20 MED ORDER — CHLORHEXIDINE GLUCONATE 0.12 % MT SOLN
OROMUCOSAL | Status: AC
  Filled 2021-04-20: qty 15

## 2021-04-20 MED ORDER — CEPHALEXIN 500 MG PO CAPS: 500.0000 mg | ORAL_CAPSULE | Freq: Every day | ORAL | 0 refills | Status: DC

## 2021-04-20 MED ORDER — ONDANSETRON HCL 4 MG/2ML IJ SOLN
INTRAMUSCULAR | Status: DC | PRN
  Administered 2021-04-20: 4 mg via INTRAVENOUS

## 2021-04-20 MED ORDER — CEFAZOLIN SODIUM-DEXTROSE 2-4 GM/100ML-% IV SOLN
INTRAVENOUS | Status: AC
  Filled 2021-04-20: qty 100

## 2021-04-20 MED ORDER — ROCURONIUM BROMIDE 100 MG/10ML IV SOLN
INTRAVENOUS | Status: DC | PRN
  Administered 2021-04-20: 60 mg via INTRAVENOUS

## 2021-04-20 MED ORDER — MEPERIDINE HCL 25 MG/ML IJ SOLN: 6.2500 mg | INTRAMUSCULAR | Status: DC | PRN

## 2021-04-20 MED ORDER — ROCURONIUM BROMIDE 10 MG/ML (PF) SYRINGE
PREFILLED_SYRINGE | INTRAVENOUS | Status: AC
  Filled 2021-04-20: qty 10

## 2021-04-20 MED ORDER — SODIUM CHLORIDE (PF) 0.9 % IJ SOLN
INTRAMUSCULAR | Status: AC
  Filled 2021-04-20: qty 10

## 2021-04-20 MED ORDER — FAMOTIDINE 20 MG PO TABS: 20.0000 mg | ORAL_TABLET | Freq: Once | ORAL | Status: AC

## 2021-04-20 MED ORDER — BELLADONNA ALKALOIDS-OPIUM 16.2-60 MG RE SUPP
RECTAL | Status: DC | PRN
  Administered 2021-04-20: 1 via RECTAL

## 2021-04-20 MED ORDER — IOHEXOL 180 MG/ML  SOLN
INTRAMUSCULAR | Status: DC | PRN
  Administered 2021-04-20: 10 mL

## 2021-04-20 MED ORDER — LIDOCAINE HCL (PF) 2 % IJ SOLN
INTRAMUSCULAR | Status: AC
  Filled 2021-04-20: qty 5

## 2021-04-20 MED ORDER — FENTANYL CITRATE (PF) 100 MCG/2ML IJ SOLN
INTRAMUSCULAR | Status: DC | PRN
  Administered 2021-04-20 (×2): 50 ug via INTRAVENOUS

## 2021-04-20 MED ORDER — ONDANSETRON HCL 4 MG/2ML IJ SOLN
INTRAMUSCULAR | Status: AC
  Filled 2021-04-20: qty 2

## 2021-04-20 MED ORDER — PROMETHAZINE HCL 25 MG/ML IJ SOLN: 6.2500 mg | INTRAMUSCULAR | Status: DC | PRN

## 2021-04-20 MED ORDER — PROPOFOL 10 MG/ML IV BOLUS
INTRAVENOUS | Status: DC | PRN
  Administered 2021-04-20: 160 mg via INTRAVENOUS

## 2021-04-20 MED ORDER — BELLADONNA ALKALOIDS-OPIUM 16.2-60 MG RE SUPP
RECTAL | Status: AC
  Filled 2021-04-20: qty 1

## 2021-04-20 MED ORDER — ORAL CARE MOUTH RINSE: 15.0000 mL | Freq: Once | OROMUCOSAL | Status: DC

## 2021-04-20 MED ORDER — OXYCODONE HCL 5 MG PO TABS: 5.0000 mg | ORAL_TABLET | Freq: Once | ORAL | Status: DC | PRN

## 2021-04-20 MED ORDER — PHENYLEPHRINE HCL (PRESSORS) 10 MG/ML IV SOLN
INTRAVENOUS | Status: DC | PRN
  Administered 2021-04-20 (×3): 100 ug via INTRAVENOUS

## 2021-04-20 MED ORDER — SODIUM CHLORIDE 0.9 % IR SOLN
Status: DC | PRN
  Administered 2021-04-20: 3000 mL

## 2021-04-20 SURGICAL SUPPLY — 30 items
BAG DRAIN CYSTO-URO LG1000N (MISCELLANEOUS) ×3 IMPLANT
BRUSH SCRUB EZ 1% IODOPHOR (MISCELLANEOUS) ×3 IMPLANT
CATH URET FLEX-TIP 2 LUMEN 10F (CATHETERS) ×3 IMPLANT
CATH URETL OPEN 5X70 (CATHETERS) IMPLANT
CNTNR SPEC 2.5X3XGRAD LEK (MISCELLANEOUS)
CONT SPEC 4OZ STER OR WHT (MISCELLANEOUS)
CONTAINER SPEC 2.5X3XGRAD LEK (MISCELLANEOUS) IMPLANT
DRAPE UTILITY 15X26 TOWEL STRL (DRAPES) ×3 IMPLANT
DRSG TEGADERM 2-3/8X2-3/4 SM (GAUZE/BANDAGES/DRESSINGS) ×3 IMPLANT
GAUZE 4X4 16PLY ~~LOC~~+RFID DBL (SPONGE) ×6 IMPLANT
GLOVE SURG UNDER POLY LF SZ7.5 (GLOVE) ×3 IMPLANT
GOWN STRL REUS W/ TWL LRG LVL3 (GOWN DISPOSABLE) ×2 IMPLANT
GOWN STRL REUS W/ TWL XL LVL3 (GOWN DISPOSABLE) ×2 IMPLANT
GOWN STRL REUS W/TWL LRG LVL3 (GOWN DISPOSABLE) ×1
GOWN STRL REUS W/TWL XL LVL3 (GOWN DISPOSABLE) ×1
GUIDEWIRE STR DUAL SENSOR (WIRE) ×6 IMPLANT
INFUSOR MANOMETER BAG 3000ML (MISCELLANEOUS) ×3 IMPLANT
IV NS IRRIG 3000ML ARTHROMATIC (IV SOLUTION) ×3 IMPLANT
KIT TURNOVER CYSTO (KITS) ×3 IMPLANT
PACK CYSTO AR (MISCELLANEOUS) ×3 IMPLANT
SET CYSTO W/LG BORE CLAMP LF (SET/KITS/TRAYS/PACK) ×3 IMPLANT
SHEATH URETERAL 12FRX35CM (MISCELLANEOUS) IMPLANT
STENT URET 6FRX24 CONTOUR (STENTS) ×3 IMPLANT
STENT URET 6FRX26 CONTOUR (STENTS) IMPLANT
SURGILUBE 2OZ TUBE FLIPTOP (MISCELLANEOUS) ×3 IMPLANT
SYR 10ML LL (SYRINGE) ×3 IMPLANT
TRACTIP FLEXIVA PULSE ID 200 (Laser) ×3 IMPLANT
VALVE UROSEAL ADJ ENDO (VALVE) ×3 IMPLANT
WATER STERILE IRR 1000ML POUR (IV SOLUTION) ×3 IMPLANT
WATER STERILE IRR 500ML POUR (IV SOLUTION) ×3 IMPLANT

## 2021-04-20 NOTE — Anesthesia Preprocedure Evaluation (Signed)
Anesthesia Evaluation  Patient identified by MRN, date of birth, ID band Patient awake    Reviewed: Allergy & Precautions, NPO status , Patient's Chart, lab work & pertinent test results  History of Anesthesia Complications Negative for: history of anesthetic complications  Airway Mallampati: III  TM Distance: >3 FB Neck ROM: Full    Dental  (+) Poor Dentition   Pulmonary asthma , neg sleep apnea,    breath sounds clear to auscultation- rhonchi (-) wheezing      Cardiovascular hypertension, Pt. on medications + CAD and + Past MI (silent)  (-) Cardiac Stents and (-) CABG  Rhythm:Regular Rate:Normal - Systolic murmurs and - Diastolic murmurs    Neuro/Psych  Headaches, neg Seizures PSYCHIATRIC DISORDERS Anxiety Depression    GI/Hepatic negative GI ROS, Neg liver ROS,   Endo/Other  diabetes, Insulin DependentHypothyroidism   Renal/GU Renal disease: nephrolithiasis.     Musculoskeletal  (+) Arthritis ,   Abdominal (+) + obese,   Peds  Hematology negative hematology ROS (+)   Anesthesia Other Findings Past Medical History: No date: Abnormal uterine bleeding No date: Adenomatous colon polyp No date: Arthritis No date: Asthma     Comment:  no inhalers No date: Chronic kidney disease     Comment:  stage 3 No date: Claustrophobia 03/20/2021: COVID-19 No date: Depression No date: Diabetes mellitus without complication (HCC)     Comment:  type 2 No date: Eczema No date: Headache     Comment:  migraines rare No date: Hemorrhoid No date: History of kidney stones No date: Hyperlipidemia No date: Hypertension No date: Hypothyroidism No date: Morbid obesity with BMI of 50.0-59.9, adult (HCC) No date: Myocardial infarction ([place])     Comment:  pt unsure when but ekg showed previous mi No date: Rosacea No date: Urinary incontinence, mixed No date: Vitamin D deficiency   Reproductive/Obstetrics                              Anesthesia Physical Anesthesia Plan  ASA: 3  Anesthesia Plan: General   Post-op Pain Management:    Induction: Intravenous  PONV Risk Score and Plan: 2 and Ondansetron and Dexamethasone  Airway Management Planned: Oral ETT  Additional Equipment:   Intra-op Plan:   Post-operative Plan: Extubation in OR  Informed Consent: I have reviewed the patients History and Physical, chart, labs and discussed the procedure including the risks, benefits and alternatives for the proposed anesthesia with the patient or authorized representative who has indicated his/her understanding and acceptance.     Dental advisory given  Plan Discussed with: CRNA and Anesthesiologist  Anesthesia Plan Comments:         Anesthesia Quick Evaluation

## 2021-04-20 NOTE — Interval H&P Note (Signed)
UROLOGY H&P UPDATE  Agree with prior H&P dated 03/28/21.  Very comorbid 60 year old female with 1.5 cm left proximal ureteral stone, intermittent renal colic, and recurrent UTIs over the last few months.  Cardiac: RRR Lungs: CTA bilaterally  Laterality: Left Procedure: Left ureteroscopy, laser lithotripsy, stent placement  Urine: Culture 9/27 no growth  We specifically discussed the risks ureteroscopy including bleeding, infection/sepsis, stent related symptoms including flank pain/urgency/frequency/incontinence/dysuria, ureteral injury, inability to access stone, or need for staged or additional procedures.   Billey Co, MD 04/20/2021

## 2021-04-20 NOTE — Op Note (Signed)
Date of procedure: 04/20/21  Preoperative diagnosis:  Left ureteral stone  Postoperative diagnosis:  Same  Procedure: Cystoscopy, left ureteroscopy, laser lithotripsy, left retrograde pyelogram with intraoperative interpretation, left ureteral stent placement  Surgeon: Nickolas Madrid, MD  Anesthesia: General  Complications: None  Intraoperative findings:  Normal cystoscopy, uncomplicated dusting of left proximal ureteral stone and stent placement  EBL: None  Specimens: None  Drains: Left 6 French by 24 cm ureteral stent  Indication: Stephanie Mathews is a 60 y.o. patient with 1.5 cm left proximal ureteral stone on CT.  After reviewing the management options for treatment, they elected to proceed with the above surgical procedure(s). We have discussed the potential benefits and risks of the procedure, side effects of the proposed treatment, the likelihood of the patient achieving the goals of the procedure, and any potential problems that might occur during the procedure or recuperation. Informed consent has been obtained.  Description of procedure:  The patient was taken to the operating room and general anesthesia was induced. SCDs were placed for DVT prophylaxis.. The patient was placed in the dorsal lithotomy position, prepped and draped in the usual sterile fashion, and preoperative antibiotics(Ancef) were administered. A preoperative time-out was performed.   A 21 French rigid cystoscope was used to intubate the urethra and thorough cystoscopy was performed.  The bladder was grossly normal, and the ureteral orifices were orthotopic bilaterally.  A sensor wire advanced easily into the left ureteral orifice and up to the kidney under fluoroscopic vision.  The stone could be seen on fluoroscopy.  The dual-lumen ureteral access catheter was advanced over the wire to the level of the stone easily, and a second safety wire was added.  A single channel digital flexible ureteroscope  advanced easily over the wire to the level of the stone in the proximal ureter.  This was then pushed gently into a upper pole calyx.  A 242 m laser fiber on settings of 0.3 J and 80 Hz was used to methodically dust the stone.  Thorough pyeloscopy at the conclusion revealed no stone fragments > 1 mm.    Retrograde pyelogram was performed from the proximal ureter and showed no extravasation or filling defects.  Careful pullback ureteroscopy showed no ureteral abnormalities.  The rigid cystoscope was backloaded over the wire and a 6 [place] by 24 cm ureteral stent was uneventfully placed with a curl in the renal pelvis, as well as in the bladder.  Fluid was seen to drain through the side-ports of the stent.  The bladder was drained and this concluded our procedure  Disposition: Stable to PACU  Plan: Follow-up in clinic in 1 to 2 weeks for stent removal Keflex prophylaxis with stent in place  Nickolas Madrid, MD

## 2021-04-20 NOTE — Transfer of Care (Signed)
Immediate Anesthesia Transfer of Care Note  Patient: Stephanie Mathews  Procedure(s) Performed: CYSTOSCOPY/URETEROSCOPY/HOLMIUM LASER/STENT PLACEMENT (Left) CYSTOSCOPY WITH RETROGRADE PYELOGRAM  Patient Location: PACU  Anesthesia Type:General  Level of Consciousness: awake, alert  and oriented  Airway & Oxygen Therapy: Patient Spontanous Breathing and Patient connected to face mask oxygen  Post-op Assessment: Report given to RN and Post -op Vital signs reviewed and stable  Post vital signs: Reviewed and stable  Last Vitals:  Vitals Value Taken Time  BP 127/61 04/20/21 1352  Temp 36.2 C 04/20/21 1352  Pulse 91 04/20/21 1356  Resp 19 04/20/21 1356  SpO2 99 % 04/20/21 1356  Vitals shown include unvalidated device data.  Last Pain:  Vitals:   04/20/21 1352  TempSrc:   PainSc: 0-No pain         Complications: No notable events documented.

## 2021-04-20 NOTE — Anesthesia Procedure Notes (Signed)
Procedure Name: Intubation Date/Time: 04/20/2021 1:03 PM Performed by: Aline Brochure, CRNA Pre-anesthesia Checklist: Patient identified, Patient being monitored, Timeout performed, Emergency Drugs available and Suction available Patient Re-evaluated:Patient Re-evaluated prior to induction Oxygen Delivery Method: Circle system utilized Preoxygenation: Pre-oxygenation with 100% oxygen Induction Type: IV induction Ventilation: Mask ventilation without difficulty Laryngoscope Size: McGraph and 4 Grade View: Grade I Tube type: Oral Tube size: 7.0 mm Number of attempts: 1 Airway Equipment and Method: Stylet and Video-laryngoscopy Placement Confirmation: ETT inserted through vocal cords under direct vision, positive ETCO2 and breath sounds checked- equal and bilateral Secured at: 21 cm Tube secured with: Tape Dental Injury: Teeth and Oropharynx as per pre-operative assessment  Difficulty Due To: Difficulty was anticipated

## 2021-04-20 NOTE — Discharge Instructions (Signed)
AMBULATORY SURGERY  ?DISCHARGE INSTRUCTIONS ? ? ?The drugs that you were given will stay in your system until tomorrow so for the next 24 hours you should not: ? ?Drive an automobile ?Make any legal decisions ?Drink any alcoholic beverage ? ? ?You may resume regular meals tomorrow.  Today it is better to start with liquids and gradually work up to solid foods. ? ?You may eat anything you prefer, but it is better to start with liquids, then soup and crackers, and gradually work up to solid foods. ? ? ?Please notify your doctor immediately if you have any unusual bleeding, trouble breathing, redness and pain at the surgery site, drainage, fever, or pain not relieved by medication. ? ? ? ?Additional Instructions: ? ? ? ?Please contact your physician with any problems or Same Day Surgery at [phone], Monday through Friday 6 am to 4 pm, or [facility] number at [phone].  ?

## 2021-04-23 ENCOUNTER — Encounter: Payer: Self-pay | Admitting: Urology

## 2021-04-23 NOTE — Anesthesia Postprocedure Evaluation (Signed)
Anesthesia Post Note  Patient: Stephanie Mathews  Procedure(s) Performed: CYSTOSCOPY/URETEROSCOPY/HOLMIUM LASER/STENT PLACEMENT (Left) CYSTOSCOPY WITH RETROGRADE PYELOGRAM  Patient location during evaluation: PACU Anesthesia Type: General Level of consciousness: awake and alert and oriented Pain management: pain level controlled Vital Signs Assessment: post-procedure vital signs reviewed and stable Respiratory status: spontaneous breathing, nonlabored ventilation and respiratory function stable Cardiovascular status: blood pressure returned to baseline and stable Postop Assessment: no signs of nausea or vomiting Anesthetic complications: no   No notable events documented.   Last Vitals:  Vitals:   04/20/21 1423 04/20/21 1443  BP: (!) 116/58 (!) 147/74  Pulse: 89 86  Resp: 13 16  Temp:  (!) 36.2 C  SpO2: 96% 96%    Last Pain:  Vitals:   04/20/21 1443  TempSrc: Temporal  PainSc: 0-No pain                 Xaviar Lunn

## 2021-05-02 ENCOUNTER — Encounter: Payer: Self-pay | Admitting: Urology

## 2021-05-02 ENCOUNTER — Other Ambulatory Visit: Payer: Self-pay

## 2021-05-02 ENCOUNTER — Ambulatory Visit (INDEPENDENT_AMBULATORY_CARE_PROVIDER_SITE_OTHER): Payer: BC Managed Care – PPO | Admitting: Urology

## 2021-05-02 VITALS — BP 113/72 | HR 110 | Ht 63.0 in | Wt 283.0 lb

## 2021-05-02 DIAGNOSIS — N200 Calculus of kidney: Secondary | ICD-10-CM

## 2021-05-02 DIAGNOSIS — Z466 Encounter for fitting and adjustment of urinary device: Secondary | ICD-10-CM | POA: Diagnosis not present

## 2021-05-02 NOTE — Addendum Note (Signed)
Addended by: Donalee Citrin on: 05/02/2021 04:14 PM   Modules accepted: Orders

## 2021-05-02 NOTE — Patient Instructions (Signed)
Dietary Guidelines to Help Prevent Kidney Stones Kidney stones are deposits of minerals and salts that form inside your kidneys. Your risk of developing kidney stones may be greater depending on your diet, your lifestyle, the medicines you take, and whether you have certain medical conditions. Most people can lower their chances of developing kidney stones by following the instructions below. Your dietitian may give you more specific instructions depending on your overall health and the type of kidney stones you tend to develop. What are tips for following this plan? Reading food labels  Choose foods with "no salt added" or "low-salt" labels. Limit your salt (sodium) intake to less than 1,500 mg a day. Choose foods with calcium for each meal and snack. Try to eat about 300 mg of calcium at each meal. Foods that contain 200-500 mg of calcium a serving include: 8 oz (237 mL) of milk, calcium-fortifiednon-dairy milk, and calcium-fortifiedfruit juice. Calcium-fortified means that calcium has been added to these drinks. 8 oz (237 mL) of kefir, yogurt, and soy yogurt. 4 oz (114 g) of tofu. 1 oz (28 g) of cheese. 1 cup (150 g) of dried figs. 1 cup (91 g) of cooked broccoli. One 3 oz (85 g) can of sardines or mackerel. Most people need 1,000-1,500 mg of calcium a day. Talk to your dietitian about how much calcium is recommended for you. Shopping Buy plenty of fresh fruits and vegetables. Most people do not need to avoid fruits and vegetables, even if these foods contain nutrients that may contribute to kidney stones. When shopping for convenience foods, choose: Whole pieces of fruit. Pre-made salads with dressing on the side. Low-fat fruit and yogurt smoothies. Avoid buying frozen meals or prepared deli foods. These can be high in sodium. Look for foods with live cultures, such as yogurt and kefir. Choose high-fiber grains, such as whole-wheat breads, oat bran, and wheat cereals. Cooking Do not add  salt to food when cooking. Place a salt shaker on the table and allow each person to add his or her own salt to taste. Use vegetable protein, such as beans, textured vegetable protein (TVP), or tofu, instead of meat in pasta, casseroles, and soups. Meal planning Eat less salt, if told by your dietitian. To do this: Avoid eating processed or pre-made food. Avoid eating fast food. Eat less animal protein, including cheese, meat, poultry, or fish, if told by your dietitian. To do this: Limit the number of times you have meat, poultry, fish, or cheese each week. Eat a diet free of meat at least 2 days a week. Eat only one serving each day of meat, poultry, fish, or seafood. When you prepare animal protein, cut pieces into small portion sizes. For most meat and fish, one serving is about the size of the palm of your hand. Eat at least five servings of fresh fruits and vegetables each day. To do this: Keep fruits and vegetables on hand for snacks. Eat one piece of fruit or a handful of berries with breakfast. Have a salad and fruit at lunch. Have two kinds of vegetables at dinner. Limit foods that are high in a substance called oxalate. These include: Spinach (cooked), rhubarb, beets, sweet potatoes, and Swiss chard. Peanuts. Potato chips, french fries, and baked potatoes with skin on. Nuts and nut products. Chocolate. If you regularly take a diuretic medicine, make sure to eat at least 1 or 2 servings of fruits or vegetables that are high in potassium each day. These include: Avocado. Banana. Orange, prune,   carrot, or tomato juice. Baked potato. Cabbage. Beans and split peas. Lifestyle  Drink enough fluid to keep your urine pale yellow. This is the most important thing you can do. Spread your fluid intake throughout the day. If you drink alcohol: Limit how much you use to: 0-1 drink a day for women who are not pregnant. 0-2 drinks a day for men. Be aware of how much alcohol is in your  drink. In the [place] one drink equals one 12 oz bottle of beer (355 mL), one 5 oz glass of wine (148 mL), or one 1 oz glass of hard liquor (44 mL). Lose weight if told by your health care provider. Work with your dietitian to find an eating plan and weight loss strategies that work best for you. General information Talk to your health care provider and dietitian about taking daily supplements. You may be told the following depending on your health and the cause of your kidney stones: Not to take supplements with vitamin C. To take a calcium supplement. To take a daily probiotic supplement. To take other supplements such as magnesium, fish oil, or vitamin B6. Take over-the-counter and prescription medicines only as told by your health care provider. These include supplements. What foods should I limit? Limit your intake of the following foods, or eat them as told by your dietitian. Vegetables Spinach. Rhubarb. Beets. Canned vegetables. Pickles. Olives. Baked potatoes with skin. Grains Wheat bran. Baked goods. Salted crackers. Cereals high in sugar. Meats and other proteins Nuts. Nut butters. Large portions of meat, poultry, or fish. Salted, precooked, or cured meats, such as sausages, meat loaves, and hot dogs. Dairy Cheese. Beverages Regular soft drinks. Regular vegetable juice. Seasonings and condiments Seasoning blends with salt. Salad dressings. Soy sauce. Ketchup. Barbecue sauce. Other foods Canned soups. Canned pasta sauce. Casseroles. Pizza. Lasagna. Frozen meals. Potato chips. French fries. The items listed above may not be a complete list of foods and beverages you should limit. Contact a dietitian for more information. What foods should I avoid? Talk to your dietitian about specific foods you should avoid based on the type of kidney stones you have and your overall health. Fruits Grapefruit. The item listed above may not be a complete list of foods and beverages you should  avoid. Contact a dietitian for more information. Summary Kidney stones are deposits of minerals and salts that form inside your kidneys. You can lower your risk of kidney stones by making changes to your diet. The most important thing you can do is drink enough fluid. Drink enough fluid to keep your urine pale yellow. Talk to your dietitian about how much calcium you should have each day, and eat less salt and animal protein as told by your dietitian. This information is not intended to replace advice given to you by your health care provider. Make sure you discuss any questions you have with your health care provider. Document Revised: 06/24/2019 Document Reviewed: 06/24/2019 Elsevier Patient Education  2022 [employer] Inc.  

## 2021-05-02 NOTE — Addendum Note (Signed)
Addended by: Tommy Rainwater on: 05/02/2021 04:43 PM   Modules accepted: Orders

## 2021-05-02 NOTE — Progress Notes (Signed)
Cystoscopy Procedure Note:  Indication: Stent removal s/p left ureteroscopy, laser lithotripsy, stent placement on 04/20/2021 for 1.5 cm left UPJ stone  After informed consent and discussion of the procedure and its risks, Stephanie Mathews was positioned and prepped in the standard fashion. Cystoscopy was performed with a flexible cystoscope. The stent was grasped with flexible graspers and removed in its entirety. The patient tolerated the procedure well.  Findings: Uncomplicated stent removal  Assessment and Plan: We discussed general stone prevention strategies including adequate hydration with goal of producing 2.5 L of urine daily, increasing citric acid intake, increasing calcium intake during high oxalate meals, minimizing animal protein, and decreasing salt intake. Information about dietary recommendations given today.   RTC 6 months with KUB  Billey Co, MD 05/02/2021

## 2021-10-31 ENCOUNTER — Ambulatory Visit: Payer: BC Managed Care – PPO | Admitting: Urology

## 2021-11-01 ENCOUNTER — Ambulatory Visit
Admission: RE | Admit: 2021-11-01 | Discharge: 2021-11-01 | Disposition: A | Discharge status: Home / Self Care | Payer: 59 | Source: Ambulatory Visit | Attending: Urology | Admitting: Urology

## 2021-11-01 ENCOUNTER — Ambulatory Visit: Payer: 59 | Admitting: Urology

## 2021-11-01 ENCOUNTER — Encounter: Payer: Self-pay | Admitting: Urology

## 2021-11-01 VITALS — BP 146/86 | HR 114 | Ht 63.0 in

## 2021-11-01 DIAGNOSIS — Z87442 Personal history of urinary calculi: Secondary | ICD-10-CM | POA: Diagnosis not present

## 2021-11-01 DIAGNOSIS — Z466 Encounter for fitting and adjustment of urinary device: Secondary | ICD-10-CM | POA: Insufficient documentation

## 2021-11-01 DIAGNOSIS — N390 Urinary tract infection, site not specified: Secondary | ICD-10-CM

## 2021-11-01 DIAGNOSIS — N200 Calculus of kidney: Secondary | ICD-10-CM

## 2021-11-01 LAB — URINALYSIS, COMPLETE
Bilirubin, UA: NEGATIVE
Ketones, UA: NEGATIVE
Nitrite, UA: POSITIVE — AB
Specific Gravity, UA: 1.015 (ref 1.005–1.030)
Urobilinogen, Ur: 0.2 mg/dL (ref 0.2–1.0)
pH, UA: 6 (ref 5.0–7.5)

## 2021-11-01 LAB — MICROSCOPIC EXAMINATION
RBC, Urine: >30 /hpf — AB (ref 0–2)
WBC, UA: >30 /hpf — AB (ref 0–5)

## 2021-11-01 MED ORDER — NITROFURANTOIN MACROCRYSTAL 50 MG PO CAPS: 50.0000 mg | ORAL_CAPSULE | Freq: Every day | ORAL | 3 refills | Status: DC

## 2021-11-01 MED ORDER — NITROFURANTOIN MONOHYD MACRO 100 MG PO CAPS: 100.0000 mg | ORAL_CAPSULE | Freq: Two times a day (BID) | ORAL | 0 refills | Status: AC

## 2021-11-01 MED ORDER — NITROFURANTOIN MACROCRYSTAL 50 MG PO CAPS: 50.0000 mg | ORAL_CAPSULE | Freq: Every day | ORAL | 0 refills | Status: DC

## 2021-11-01 NOTE — Addendum Note (Signed)
Addended by: Despina Hidden on: 11/01/2021 02:32 PM ? ? Modules accepted: Orders ? ?

## 2021-11-01 NOTE — Progress Notes (Signed)
? ?  11/01/2021 ?2:21 PM  ? ?Gleed ?[birth year] ?[id number] ? ?Reason for visit: Follow up nephrolithiasis, recurrent UTIs ? ?HPI: ?Comorbid 60 year old female with morbid obesity BMI 50, diabetes who underwent left ureteroscopy and laser lithotripsy for a 1.5 cm left UPJ stone in October 2022.  She has done well since that time, with only 1 UTI in February 2023.  She feels over the last 2 to 3 days she may be developing another UTI with urgency, frequency, and dysuria.  We will send a urinalysis and culture today. ? ?I personally viewed and interpreted her KUB today that shows no obvious evidence of recurrence of stone disease, limited by body habitus. ? ?We discussed the evaluation and treatment of patients with recurrent UTIs at length.  We specifically discussed the differences between asymptomatic bacteriuria and true urinary tract infection.  We discussed the AUA definition of recurrent UTI of at least 2 culture proven symptomatic acute cystitis episodes in a 77-monthperiod, or 3 within a 1 year period.  We discussed the importance of culture directed antibiotic treatment, and antibiotic stewardship.  First-line therapy includes nitrofurantoin(5 days), Bactrim(3 days), or fosfomycin(3 g single dose).  Possible etiologies of recurrent infection include periurethral tissue atrophy in postmenopausal woman, constipation, sexual activity, incomplete emptying, anatomic abnormalities, and even genetic predisposition.  Finally, we discussed the role of perineal hygiene, timed voiding, adequate hydration, topical vaginal estrogen, cranberry prophylaxis, and low-dose antibiotic prophylaxis. ? ?We discussed general stone prevention strategies including adequate hydration with goal of producing 2.5 L of urine daily, increasing citric acid intake, increasing calcium intake during high oxalate meals, minimizing animal protein, and decreasing salt intake. Information about dietary recommendations given today.   ? ?-Urinalysis and culture today, follow-up results ?-Nitrofurantoin 100 mg twice daily x5 days, followed by 90 days low-dose 100 mg daily prophylaxis ?-RTC 1 year KUB ? ?BBilley Co MD ? ?[facility]?[address] ?[place] [address]?[phone] ? ?

## 2021-11-01 NOTE — Patient Instructions (Signed)
Dietary Guidelines to Help Prevent Kidney Stones Kidney stones are deposits of minerals and salts that form inside your kidneys. Your risk of developing kidney stones may be greater depending on your diet, your lifestyle, the medicines you take, and whether you have certain medical conditions. Most people can lower their chances of developing kidney stones by following the instructions below. Your dietitian may give you more specific instructions depending on your overall health and the type of kidney stones you tend to develop. What are tips for following this plan? Reading food labels  Choose foods with "no salt added" or "low-salt" labels. Limit your salt (sodium) intake to less than 1,500 mg a day. Choose foods with calcium for each meal and snack. Try to eat about 300 mg of calcium at each meal. Foods that contain 200-500 mg of calcium a serving include: 8 oz (237 mL) of milk, calcium-fortifiednon-dairy milk, and calcium-fortifiedfruit juice. Calcium-fortified means that calcium has been added to these drinks. 8 oz (237 mL) of kefir, yogurt, and soy yogurt. 4 oz (114 g) of tofu. 1 oz (28 g) of cheese. 1 cup (150 g) of dried figs. 1 cup (91 g) of cooked broccoli. One 3 oz (85 g) can of sardines or mackerel. Most people need 1,000-1,500 mg of calcium a day. Talk to your dietitian about how much calcium is recommended for you. Shopping Buy plenty of fresh fruits and vegetables. Most people do not need to avoid fruits and vegetables, even if these foods contain nutrients that may contribute to kidney stones. When shopping for convenience foods, choose: Whole pieces of fruit. Pre-made salads with dressing on the side. Low-fat fruit and yogurt smoothies. Avoid buying frozen meals or prepared deli foods. These can be high in sodium. Look for foods with live cultures, such as yogurt and kefir. Choose high-fiber grains, such as whole-wheat breads, oat bran, and wheat cereals. Cooking Do not add  salt to food when cooking. Place a salt shaker on the table and allow each person to add his or her own salt to taste. Use vegetable protein, such as beans, textured vegetable protein (TVP), or tofu, instead of meat in pasta, casseroles, and soups. Meal planning Eat less salt, if told by your dietitian. To do this: Avoid eating processed or pre-made food. Avoid eating fast food. Eat less animal protein, including cheese, meat, poultry, or fish, if told by your dietitian. To do this: Limit the number of times you have meat, poultry, fish, or cheese each week. Eat a diet free of meat at least 2 days a week. Eat only one serving each day of meat, poultry, fish, or seafood. When you prepare animal protein, cut pieces into small portion sizes. For most meat and fish, one serving is about the size of the palm of your hand. Eat at least five servings of fresh fruits and vegetables each day. To do this: Keep fruits and vegetables on hand for snacks. Eat one piece of fruit or a handful of berries with breakfast. Have a salad and fruit at lunch. Have two kinds of vegetables at dinner. Limit foods that are high in a substance called oxalate. These include: Spinach (cooked), rhubarb, beets, sweet potatoes, and Swiss chard. Peanuts. Potato chips, french fries, and baked potatoes with skin on. Nuts and nut products. Chocolate. If you regularly take a diuretic medicine, make sure to eat at least 1 or 2 servings of fruits or vegetables that are high in potassium each day. These include: Avocado. Banana. Orange, prune,   carrot, or tomato juice. Baked potato. Cabbage. Beans and split peas. Lifestyle  Drink enough fluid to keep your urine pale yellow. This is the most important thing you can do. Spread your fluid intake throughout the day. If you drink alcohol: Limit how much you use to: 0-1 drink a day for women who are not pregnant. 0-2 drinks a day for men. Be aware of how much alcohol is in your  drink. In the [place] one drink equals one 12 oz bottle of beer (355 mL), one 5 oz glass of wine (148 mL), or one 1 oz glass of hard liquor (44 mL). Lose weight if told by your health care provider. Work with your dietitian to find an eating plan and weight loss strategies that work best for you. General information Talk to your health care provider and dietitian about taking daily supplements. You may be told the following depending on your health and the cause of your kidney stones: Not to take supplements with vitamin C. To take a calcium supplement. To take a daily probiotic supplement. To take other supplements such as magnesium, fish oil, or vitamin B6. Take over-the-counter and prescription medicines only as told by your health care provider. These include supplements. What foods should I limit? Limit your intake of the following foods, or eat them as told by your dietitian. Vegetables Spinach. Rhubarb. Beets. Canned vegetables. Pickles. Olives. Baked potatoes with skin. Grains Wheat bran. Baked goods. Salted crackers. Cereals high in sugar. Meats and other proteins Nuts. Nut butters. Large portions of meat, poultry, or fish. Salted, precooked, or cured meats, such as sausages, meat loaves, and hot dogs. Dairy Cheese. Beverages Regular soft drinks. Regular vegetable juice. Seasonings and condiments Seasoning blends with salt. Salad dressings. Soy sauce. Ketchup. Barbecue sauce. Other foods Canned soups. Canned pasta sauce. Casseroles. Pizza. Lasagna. Frozen meals. Potato chips. French fries. The items listed above may not be a complete list of foods and beverages you should limit. Contact a dietitian for more information. What foods should I avoid? Talk to your dietitian about specific foods you should avoid based on the type of kidney stones you have and your overall health. Fruits Grapefruit. The item listed above may not be a complete list of foods and beverages you should  avoid. Contact a dietitian for more information. Summary Kidney stones are deposits of minerals and salts that form inside your kidneys. You can lower your risk of kidney stones by making changes to your diet. The most important thing you can do is drink enough fluid. Drink enough fluid to keep your urine pale yellow. Talk to your dietitian about how much calcium you should have each day, and eat less salt and animal protein as told by your dietitian. This information is not intended to replace advice given to you by your health care provider. Make sure you discuss any questions you have with your health care provider. Document Revised: 03/12/2021 Document Reviewed: 03/12/2021 Elsevier Patient Education  2023 Elsevier Inc.  

## 2021-11-05 LAB — CULTURE, URINE COMPREHENSIVE

## 2021-11-06 ENCOUNTER — Other Ambulatory Visit: Payer: Self-pay | Admitting: Nurse Practitioner

## 2021-11-06 DIAGNOSIS — Z1231 Encounter for screening mammogram for malignant neoplasm of breast: Secondary | ICD-10-CM

## 2021-12-17 ENCOUNTER — Ambulatory Visit
Admission: RE | Admit: 2021-12-17 | Discharge: 2021-12-17 | Disposition: A | Discharge status: Home / Self Care | Payer: 59 | Source: Ambulatory Visit | Attending: Nurse Practitioner | Admitting: Nurse Practitioner

## 2021-12-17 DIAGNOSIS — Z1231 Encounter for screening mammogram for malignant neoplasm of breast: Secondary | ICD-10-CM | POA: Diagnosis not present

## 2022-11-04 ENCOUNTER — Ambulatory Visit: Payer: 59 | Admitting: Urology

## 2022-11-04 ENCOUNTER — Other Ambulatory Visit: Payer: Self-pay

## 2022-11-04 DIAGNOSIS — N200 Calculus of kidney: Secondary | ICD-10-CM

## 2022-11-04 NOTE — Progress Notes (Signed)
kub

## 2022-11-05 ENCOUNTER — Ambulatory Visit
Admission: RE | Admit: 2022-11-05 | Discharge: 2022-11-05 | Disposition: A | Discharge status: Home / Self Care | Payer: 59 | Attending: Urology | Admitting: Urology

## 2022-11-05 ENCOUNTER — Telehealth: Payer: Self-pay

## 2022-11-05 ENCOUNTER — Encounter: Payer: Self-pay | Admitting: Urology

## 2022-11-05 ENCOUNTER — Ambulatory Visit
Admission: RE | Admit: 2022-11-05 | Discharge: 2022-11-05 | Disposition: A | Discharge status: Home / Self Care | Payer: 59 | Source: Ambulatory Visit | Attending: Urology | Admitting: Urology

## 2022-11-05 ENCOUNTER — Other Ambulatory Visit
Admission: RE | Admit: 2022-11-05 | Discharge: 2022-11-05 | Disposition: A | Discharge status: Home / Self Care | Payer: 59 | Source: Home / Self Care | Attending: Urology | Admitting: Urology

## 2022-11-05 ENCOUNTER — Ambulatory Visit (INDEPENDENT_AMBULATORY_CARE_PROVIDER_SITE_OTHER): Payer: 59 | Admitting: Urology

## 2022-11-05 VITALS — BP 169/84 | HR 123 | Ht 63.0 in | Wt 301.0 lb

## 2022-11-05 DIAGNOSIS — N390 Urinary tract infection, site not specified: Secondary | ICD-10-CM | POA: Insufficient documentation

## 2022-11-05 DIAGNOSIS — Z87440 Personal history of urinary (tract) infections: Secondary | ICD-10-CM | POA: Diagnosis not present

## 2022-11-05 DIAGNOSIS — R109 Unspecified abdominal pain: Secondary | ICD-10-CM

## 2022-11-05 DIAGNOSIS — N200 Calculus of kidney: Secondary | ICD-10-CM

## 2022-11-05 DIAGNOSIS — Z87442 Personal history of urinary calculi: Secondary | ICD-10-CM

## 2022-11-05 LAB — URINALYSIS, COMPLETE (UACMP) WITH MICROSCOPIC
Bilirubin Urine: NEGATIVE
Glucose, UA: 500 mg/dL — AB
Ketones, ur: NEGATIVE mg/dL
Nitrite: NEGATIVE
Protein, ur: 30 mg/dL — AB
Specific Gravity, Urine: 1.015 (ref 1.005–1.030)
pH: 6 (ref 5.0–8.0)

## 2022-11-05 MED ORDER — TAMSULOSIN HCL 0.4 MG PO CAPS: 0.4000 mg | ORAL_CAPSULE | Freq: Every day | ORAL | 0 refills | Status: DC

## 2022-11-05 NOTE — Telephone Encounter (Signed)
-----   Message from Sondra Come, MD sent at 11/05/2022 12:32 PM EDT ----- Urinalysis does show some microscopic blood.  Recommend taking the Flomax as prescribed, and if she has persistent symptoms would recommend CT for further evaluation.  No need for antibiotics with her lack of UTI type symptoms, will follow-up final culture results  Legrand Rams, MD 11/05/2022

## 2022-11-05 NOTE — Telephone Encounter (Signed)
Called pt informed of the information below. Advised pt to call office if symptoms worsen. Advised pt to call back to the office on Thursday to re access symptoms before the weekend. Pt voiced understanding.

## 2022-11-05 NOTE — Progress Notes (Signed)
   11/05/2022 11:31 AM   [name] 01/22/1961 [id number]  Reason for visit: Follow up nephrolithiasis, recurrent UTIs  HPI: Comorbid 62 year old female with morbid obesity BMI 53, diabetes who underwent left ureteroscopy and laser lithotripsy for a 1.5 cm left UPJ stone in October 2022.  She has done well since that time, with a UTI in February 2023 and April 2023.  She opted for a 90-day course of nitrofurantoin at that time, and has not had any UTIs over the last year.  She reports a few days of some left-sided flank pain and she wonders if this could be related to a stone.  She denies any urinary symptoms or gross hematuria.  I personally viewed and interpreted her KUB today that shows no obvious evidence of recurrence of stone disease, limited by body habitus.  We reviewed options including urinalysis today to evaluate for microscopic hematuria, trial of Flomax for possible small stone not visible on KUB or uric acid stone, or CT for more definitive diagnosis.  She would like to start with a urinalysis and trial of Flomax, and I recommended considering CT if worsening symptoms.  We discussed general stone prevention strategies including adequate hydration with goal of producing 2.5 L of urine daily, increasing citric acid intake, increasing calcium intake during high oxalate meals, minimizing animal protein, and decreasing salt intake. Information about dietary recommendations given today.   Trial of Flomax for possible nephrolithiasis, follow-up urinalysis today She would like to hold off on CT scan at this time, okay to order CT stone protocol if ongoing symptoms Continue yearly follow-up for KUB screening    Sondra Come, MD  [facility] [address], [address] [place], [place] [address] [phone]

## 2022-11-06 LAB — URINE CULTURE

## 2022-11-07 ENCOUNTER — Other Ambulatory Visit: Payer: Self-pay

## 2022-11-07 DIAGNOSIS — A498 Other bacterial infections of unspecified site: Secondary | ICD-10-CM

## 2022-11-07 LAB — URINE CULTURE: Culture: >=100000 — AB

## 2022-11-07 MED ORDER — SULFAMETHOXAZOLE-TRIMETHOPRIM 800-160 MG PO TABS
1.0000 | ORAL_TABLET | Freq: Two times a day (BID) | ORAL | 0 refills | Status: AC
Start: 2022-11-07 — End: 2022-11-12

## 2022-11-27 ENCOUNTER — Other Ambulatory Visit: Payer: Self-pay | Admitting: Nurse Practitioner

## 2022-11-27 DIAGNOSIS — Z1231 Encounter for screening mammogram for malignant neoplasm of breast: Secondary | ICD-10-CM

## 2022-12-04 ENCOUNTER — Other Ambulatory Visit: Payer: Self-pay

## 2022-12-04 DIAGNOSIS — N390 Urinary tract infection, site not specified: Secondary | ICD-10-CM

## 2022-12-04 MED ORDER — SULFAMETHOXAZOLE-TRIMETHOPRIM 800-160 MG PO TABS
1.0000 | ORAL_TABLET | Freq: Two times a day (BID) | ORAL | 0 refills | Status: DC
Start: 2022-12-04 — End: 2023-09-30

## 2022-12-04 NOTE — Telephone Encounter (Signed)
See my chart message

## 2023-01-21 ENCOUNTER — Ambulatory Visit
Admission: RE | Admit: 2023-01-21 | Discharge: 2023-01-21 | Disposition: A | Discharge status: Home / Self Care | Payer: 59 | Source: Ambulatory Visit | Attending: Nurse Practitioner | Admitting: Nurse Practitioner

## 2023-01-21 DIAGNOSIS — Z1231 Encounter for screening mammogram for malignant neoplasm of breast: Secondary | ICD-10-CM | POA: Diagnosis present

## 2023-06-09 ENCOUNTER — Encounter: Payer: Self-pay | Admitting: Gastroenterology

## 2023-09-12 ENCOUNTER — Ambulatory Visit: Payer: 59 | Admitting: Anesthesiology

## 2023-09-12 ENCOUNTER — Encounter: Payer: Self-pay | Admitting: Gastroenterology

## 2023-09-12 ENCOUNTER — Ambulatory Visit
Admission: RE | Admit: 2023-09-12 | Discharge: 2023-09-12 | Disposition: A | Discharge status: Home / Self Care | Payer: 59 | Source: Ambulatory Visit | Attending: Gastroenterology | Admitting: Gastroenterology

## 2023-09-12 ENCOUNTER — Encounter
Admission: RE | Disposition: A | Discharge status: Home / Self Care | Payer: Self-pay | Source: Ambulatory Visit | Attending: Gastroenterology

## 2023-09-12 DIAGNOSIS — Z794 Long term (current) use of insulin: Secondary | ICD-10-CM | POA: Diagnosis not present

## 2023-09-12 DIAGNOSIS — Z7985 Long-term (current) use of injectable non-insulin antidiabetic drugs: Secondary | ICD-10-CM | POA: Insufficient documentation

## 2023-09-12 DIAGNOSIS — Z79899 Other long term (current) drug therapy: Secondary | ICD-10-CM | POA: Diagnosis not present

## 2023-09-12 DIAGNOSIS — I252 Old myocardial infarction: Secondary | ICD-10-CM | POA: Diagnosis not present

## 2023-09-12 DIAGNOSIS — D123 Benign neoplasm of transverse colon: Secondary | ICD-10-CM | POA: Diagnosis not present

## 2023-09-12 DIAGNOSIS — N189 Chronic kidney disease, unspecified: Secondary | ICD-10-CM | POA: Diagnosis not present

## 2023-09-12 DIAGNOSIS — Z1211 Encounter for screening for malignant neoplasm of colon: Secondary | ICD-10-CM | POA: Insufficient documentation

## 2023-09-12 DIAGNOSIS — E039 Hypothyroidism, unspecified: Secondary | ICD-10-CM | POA: Diagnosis not present

## 2023-09-12 DIAGNOSIS — D124 Benign neoplasm of descending colon: Secondary | ICD-10-CM | POA: Diagnosis not present

## 2023-09-12 DIAGNOSIS — Z6841 Body Mass Index (BMI) 40.0 and over, adult: Secondary | ICD-10-CM | POA: Insufficient documentation

## 2023-09-12 DIAGNOSIS — E11319 Type 2 diabetes mellitus with unspecified diabetic retinopathy without macular edema: Secondary | ICD-10-CM | POA: Insufficient documentation

## 2023-09-12 DIAGNOSIS — E1122 Type 2 diabetes mellitus with diabetic chronic kidney disease: Secondary | ICD-10-CM | POA: Diagnosis not present

## 2023-09-12 DIAGNOSIS — Z79890 Hormone replacement therapy (postmenopausal): Secondary | ICD-10-CM | POA: Insufficient documentation

## 2023-09-12 DIAGNOSIS — Z7984 Long term (current) use of oral hypoglycemic drugs: Secondary | ICD-10-CM | POA: Diagnosis not present

## 2023-09-12 DIAGNOSIS — J45909 Unspecified asthma, uncomplicated: Secondary | ICD-10-CM | POA: Insufficient documentation

## 2023-09-12 DIAGNOSIS — K5900 Constipation, unspecified: Secondary | ICD-10-CM | POA: Diagnosis not present

## 2023-09-12 DIAGNOSIS — I129 Hypertensive chronic kidney disease with stage 1 through stage 4 chronic kidney disease, or unspecified chronic kidney disease: Secondary | ICD-10-CM | POA: Diagnosis not present

## 2023-09-12 HISTORY — DX: Proteinuria, unspecified: R80.9

## 2023-09-12 HISTORY — PX: COLONOSCOPY WITH PROPOFOL: SHX5780

## 2023-09-12 HISTORY — PX: POLYPECTOMY: SHX5525

## 2023-09-12 HISTORY — DX: Type 2 diabetes mellitus with unspecified diabetic retinopathy without macular edema: E11.319

## 2023-09-12 HISTORY — DX: Long term (current) use of insulin: Z79.4

## 2023-09-12 LAB — GLUCOSE, CAPILLARY: Glucose-Capillary: 188 mg/dL — ABNORMAL HIGH (ref 70–99)

## 2023-09-12 SURGERY — COLONOSCOPY WITH PROPOFOL
Anesthesia: General

## 2023-09-12 MED ORDER — PROPOFOL 10 MG/ML IV BOLUS
INTRAVENOUS | Status: DC | PRN
  Administered 2023-09-12: 75 ug/kg/min via INTRAVENOUS
  Administered 2023-09-12: 30 mg via INTRAVENOUS
  Administered 2023-09-12: 40 mg via INTRAVENOUS

## 2023-09-12 MED ORDER — PROPOFOL 10 MG/ML IV BOLUS
INTRAVENOUS | Status: AC
  Filled 2023-09-12: qty 20

## 2023-09-12 MED ORDER — LIDOCAINE HCL (CARDIAC) PF 100 MG/5ML IV SOSY
PREFILLED_SYRINGE | INTRAVENOUS | Status: DC | PRN
  Administered 2023-09-12: 60 mg via INTRAVENOUS

## 2023-09-12 MED ORDER — SODIUM CHLORIDE 0.9 % IV SOLN
INTRAVENOUS | Status: DC
  Administered 2023-09-12: 20 mL/h via INTRAVENOUS

## 2023-09-12 MED ORDER — LIDOCAINE HCL (PF) 2 % IJ SOLN
INTRAMUSCULAR | Status: AC
  Filled 2023-09-12: qty 5

## 2023-09-12 MED ORDER — DEXMEDETOMIDINE HCL IN NACL 80 MCG/20ML IV SOLN
INTRAVENOUS | Status: DC | PRN
  Administered 2023-09-12: 8 ug via INTRAVENOUS

## 2023-09-12 NOTE — Anesthesia Preprocedure Evaluation (Signed)
 Anesthesia Evaluation  Patient identified by MRN, date of birth, ID band Patient awake    Reviewed: Allergy & Precautions, NPO status , Patient's Chart, lab work & pertinent test results  History of Anesthesia Complications Negative for: history of anesthetic complications  Airway Mallampati: III  TM Distance: >3 FB Neck ROM: full    Dental no notable dental hx.    Pulmonary asthma    Pulmonary exam normal        Cardiovascular hypertension, On Medications + Past MI  Normal cardiovascular exam     Neuro/Psych  Headaches PSYCHIATRIC DISORDERS  Depression       GI/Hepatic negative GI ROS, Neg liver ROS,,,  Endo/Other  diabetesHypothyroidism  Class 4 obesity (BMI 53)  Renal/GU Renal disease  negative genitourinary   Musculoskeletal   Abdominal   Peds  Hematology negative hematology ROS (+)   Anesthesia Other Findings Past Medical History: No date: Abnormal uterine bleeding No date: Adenomatous colon polyp No date: Arthritis No date: Asthma     Comment:  no inhalers No date: Chronic kidney disease     Comment:  stage 3 No date: Claustrophobia 03/20/2021: COVID-19 No date: Depression No date: Diabetes mellitus without complication (HCC)     Comment:  type 2 No date: Diabetic retinopathy associated with controlled type 2  diabetes mellitus (HCC) No date: Eczema No date: Headache     Comment:  migraines rare No date: Hemorrhoid No date: History of kidney stones No date: Hyperlipidemia No date: Hypertension No date: Hypothyroidism No date: Kidney stone No date: Long-term insulin use (HCC) No date: Microalbuminuria No date: Morbid obesity with BMI of 50.0-59.9, adult (HCC) No date: Myocardial infarction [facility])     Comment:  pt unsure when but ekg showed previous mi No date: Rosacea No date: Urinary incontinence, mixed No date: Vitamin D deficiency  Past Surgical History: 1995: CESAREAN SECTION No  date: COLONOSCOPY 12/01/2017: COLONOSCOPY WITH PROPOFOL; N/A     Comment:  Procedure: COLONOSCOPY WITH PROPOFOL;  Surgeon: Scot Jun, MD;  Location: [facility] ENDOSCOPY;  Service:               Endoscopy;  Laterality: N/A; 1982: CYST EXCISION     Comment:  foot 04/20/2021: CYSTOSCOPY W/ RETROGRADES     Comment:  Procedure: CYSTOSCOPY WITH RETROGRADE PYELOGRAM;                Surgeon: Sondra Come, MD;  Location: [facility] ORS;                Service: Urology;; 04/20/2021: CYSTOSCOPY/URETEROSCOPY/HOLMIUM LASER/STENT PLACEMENT;  Left     Comment:  Procedure: CYSTOSCOPY/URETEROSCOPY/HOLMIUM LASER/STENT               PLACEMENT;  Surgeon: Sondra Come, MD;  Location:               [facility] ORS;  Service: Urology;  Laterality: Left; No date: JOINT REPLACEMENT 04/27/2018: KNEE ARTHROPLASTY; Left     Comment:  Procedure: COMPUTER ASSISTED TOTAL KNEE ARTHROPLASTY;                Surgeon: Donato Heinz, MD;  Location: [facility] ORS;                Service: Orthopedics;  Laterality: Left; No date: LITHOTRIPSY     Reproductive/Obstetrics negative OB ROS  Anesthesia Physical Anesthesia Plan  ASA: 3  Anesthesia Plan: General   Post-op Pain Management: Minimal or no pain anticipated   Induction: Intravenous  PONV Risk Score and Plan: 2 and Propofol infusion and TIVA  Airway Management Planned: Natural Airway, Nasal Cannula and Nasal CPAP  Additional Equipment:   Intra-op Plan:   Post-operative Plan:   Informed Consent: I have reviewed the patients History and Physical, chart, labs and discussed the procedure including the risks, benefits and alternatives for the proposed anesthesia with the patient or authorized representative who has indicated his/her understanding and acceptance.     Dental Advisory Given  Plan Discussed with: Anesthesiologist, CRNA and Surgeon  Anesthesia Plan Comments: (Patient consented for  risks of anesthesia including but not limited to:  - adverse reactions to medications - risk of airway placement if required - damage to eyes, teeth, lips or other oral mucosa - nerve damage due to positioning  - sore throat or hoarseness - Damage to heart, brain, nerves, lungs, other parts of body or loss of life  Patient voiced understanding and assent.)        Anesthesia Quick Evaluation

## 2023-09-12 NOTE — Anesthesia Procedure Notes (Signed)
 Procedure Name: General with mask airway Date/Time: 09/12/2023 9:48 AM  Performed by: Lily Lovings, CRNAPre-anesthesia Checklist: Patient identified, Emergency Drugs available, Suction available, Patient being monitored and Timeout performed Patient Re-evaluated:Patient Re-evaluated prior to induction Oxygen Delivery Method: Simple face mask Preoxygenation: Pre-oxygenation with 100% oxygen Induction Type: IV induction Comments: pom

## 2023-09-12 NOTE — Op Note (Signed)
 [facility] Patient Name: Stephanie Mathews Procedure Date: 09/12/2023 9:38 AM MRN: [id number] Account #: [id number] Date of Birth: [birth year] Admit Type: Outpatient Age: 63 Room: [facility] ENDO ROOM 1 Gender: Female Note Status: Finalized Instrument Name: Colonoscope [id number] Procedure:             Colonoscopy Indications:           High risk colon cancer surveillance: Personal history                         of colonic polyps Providers:             Trenda Moots, DO Referring MD:          Caryl Asp (Referring MD) Medicines:             Monitored Anesthesia Care Complications:         No immediate complications. Estimated blood loss:                         Minimal. Procedure:             Pre-Anesthesia Assessment:                        - Prior to the procedure, a History and Physical was                         performed, and patient medications and allergies were                         reviewed. The patient is competent. The risks and                         benefits of the procedure and the sedation options and                         risks were discussed with the patient. All questions                         were answered and informed consent was obtained.                         Patient identification and proposed procedure were                         verified by the physician, the nurse, the anesthetist                         and the technician in the endoscopy suite. Mental                         Status Examination: alert and oriented. Airway                         Examination: normal oropharyngeal airway and neck                         mobility. Respiratory Examination: clear to  auscultation. CV Examination: RRR, no murmurs, no S3                         or S4. Prophylactic Antibiotics: The patient does not                         require prophylactic antibiotics. Prior                          Anticoagulants: The patient has taken no anticoagulant                         or antiplatelet agents. ASA Grade Assessment: III - A                         patient with severe systemic disease. After reviewing                         the risks and benefits, the patient was deemed in                         satisfactory condition to undergo the procedure. The                         anesthesia plan was to use monitored anesthesia care                         (MAC). Immediately prior to administration of                         medications, the patient was re-assessed for adequacy                         to receive sedatives. The heart rate, respiratory                         rate, oxygen saturations, blood pressure, adequacy of                         pulmonary ventilation, and response to care were                         monitored throughout the procedure. The physical                         status of the patient was re-assessed after the                         procedure.                        After obtaining informed consent, the colonoscope was                         passed under direct vision. Throughout the procedure,                         the patient's blood pressure, pulse, and oxygen  saturations were monitored continuously. The                         Colonoscope was introduced through the anus and                         advanced to the the cecum, identified by the ileocecal                         valve. The colonoscopy was somewhat difficult due to                         significant looping and the patient's body habitus.                         Successful completion of the procedure was aided by                         straightening and shortening the scope to obtain bowel                         loop reduction, using scope torsion and lavage. The                         patient tolerated the procedure well. The quality of                          the bowel preparation was evaluated using the BBPS                         [facility] Bowel Preparation Scale) with scores of: Right                         Colon = 3, Transverse Colon = 3 and Left Colon = 3                         (entire mucosa seen well with no residual staining,                         small fragments of stool or opaque liquid). The total                         BBPS score equals 9. The ileocecal valve and the                         rectum were photographed. Findings:      The perianal and digital rectal examinations were normal. Pertinent       negatives include normal sphincter tone.      Two sessile polyps were found in the descending colon and hepatic       flexure. The polyps were 2 to 5 mm in size. These polyps were removed       with a cold snare. Resection and retrieval were complete. Estimated       blood loss was minimal.      Able to advance scope to the cecum and ICV. Unable to see behind the ICV  despite manuevers in attempt to do so. No large lesion appreciated       emanating from the cecum. Estimated blood loss: none.      The exam was otherwise without abnormality on direct and retroflexion       views. Impression:            - Two 2 to 5 mm polyps in the descending colon and at                         the hepatic flexure, removed with a cold snare.                         Resected and retrieved.                        - The examination was otherwise normal on direct and                         retroflexion views. Recommendation:        - Patient has a contact number available for                         emergencies. The signs and symptoms of potential                         delayed complications were discussed with the patient.                         Return to normal activities tomorrow. Written                         discharge instructions were provided to the patient.                        - Discharge patient to home.                         - Resume previous diet.                        - Continue present medications.                        - No ibuprofen, naproxen, or other non-steroidal                         anti-inflammatory drugs for 5 days after polyp removal.                        - Await pathology results.                        - Repeat colonoscopy for surveillance based on                         pathology results.                        - Return to referring physician as previously  scheduled.                        - The findings and recommendations were discussed with                         the patient. Procedure Code(s):     --- Professional ---                        [phone], Colonoscopy, flexible; with removal of                         tumor(s), polyp(s), or other lesion(s) by snare                         technique Diagnosis Code(s):     --- Professional ---                        Z86.010, Personal history of colonic polyps                        D12.4, Benign neoplasm of descending colon                        D12.3, Benign neoplasm of transverse colon (hepatic                         flexure or splenic flexure) CPT copyright 2022 American Medical Association. All rights reserved. The codes documented in this report are preliminary and upon coder review may  be revised to meet current compliance requirements. Attending Participation:      I personally performed the entire procedure. Elfredia Nevins, DO [name] DO, DO 09/12/2023 10:19:47 AM This report has been signed electronically. Number of Addenda: 0 Note Initiated On: 09/12/2023 9:38 AM Scope Withdrawal Time: 0 hours 13 minutes 37 seconds  Total Procedure Duration: 0 hours 21 minutes 45 seconds  Estimated Blood Loss:  Estimated blood loss was minimal.      [facility]

## 2023-09-12 NOTE — Transfer of Care (Signed)
 Immediate Anesthesia Transfer of Care Note  Patient: Stephanie Mathews  Procedure(s) Performed: COLONOSCOPY WITH PROPOFOL POLYPECTOMY  Patient Location: Endoscopy Unit  Anesthesia Type:General  Level of Consciousness: patient cooperative  Airway & Oxygen Therapy: Patient Spontanous Breathing  Post-op Assessment: Report given to RN and Post -op Vital signs reviewed and stable  Post vital signs: Reviewed and stable  Last Vitals:  Vitals Value Taken Time  BP 121/74 09/12/23 1019  Temp 36.7 C 09/12/23 1019  Pulse 97 09/12/23 1019  Resp 14 09/12/23 1019  SpO2 96 % 09/12/23 1019    Last Pain:  Vitals:   09/12/23 1019  TempSrc: Tympanic  PainSc: Asleep         Complications: No notable events documented.

## 2023-09-12 NOTE — Anesthesia Postprocedure Evaluation (Signed)
 Anesthesia Post Note  Patient: Stephanie Mathews  Procedure(s) Performed: COLONOSCOPY WITH PROPOFOL POLYPECTOMY  Patient location during evaluation: Endoscopy Anesthesia Type: General Level of consciousness: awake and alert Pain management: pain level controlled Vital Signs Assessment: post-procedure vital signs reviewed and stable Respiratory status: spontaneous breathing, nonlabored ventilation, respiratory function stable and patient connected to nasal cannula oxygen Cardiovascular status: blood pressure returned to baseline and stable Postop Assessment: no apparent nausea or vomiting Anesthetic complications: no   No notable events documented.   Last Vitals:  Vitals:   09/12/23 1019 09/12/23 1028  BP: 121/74 (!) 142/71  Pulse: 97 96  Resp: 14 13  Temp: 36.7 C   SpO2: 96% 97%    Last Pain:  Vitals:   09/12/23 1028  TempSrc:   PainSc: 0-No pain                 Louie Boston

## 2023-09-12 NOTE — H&P (Signed)
 Pre-Procedure H&P   Patient ID: Stephanie Mathews is a 63 y.o. female.  Gastroenterology Provider: [name], DO  Referring Provider: Fransico Setters, NP PCP: Myrene Buddy, NP  Date: 09/12/2023  HPI Ms. ALDEAN SUDDETH is a 63 y.o. female who presents today for Colonoscopy for Personal history of colon polyps .  Mild constipation. No melena/hematochezia  Greggory Keen has been held for procedure (last dose 1.5 week ago)  No fhx colon cancer or polyps  11/2017- negative for polyps; Int Hemorrhoids CSY12/2013- one TA< IH  Hgb 15.6 mcv 94 plt 245 cr 1.4 ferr 58   Past Medical History:  Diagnosis Date   Abnormal uterine bleeding    Adenomatous colon polyp    Arthritis    Asthma    no inhalers   Chronic kidney disease    stage 3   Claustrophobia    COVID-19 03/20/2021   Depression    Diabetes mellitus without complication (HCC)    type 2   Diabetic retinopathy associated with controlled type 2 diabetes mellitus (HCC)    Eczema    Headache    migraines rare   Hemorrhoid    History of kidney stones    Hyperlipidemia    Hypertension    Hypothyroidism    Kidney stone    Long-term insulin use (HCC)    Microalbuminuria    Morbid obesity with BMI of 50.0-59.9, adult (HCC)    Myocardial infarction (HCC)    pt unsure when but ekg showed previous mi   Rosacea    Urinary incontinence, mixed    Vitamin D deficiency     Past Surgical History:  Procedure Laterality Date   CESAREAN SECTION  1995   COLONOSCOPY     COLONOSCOPY WITH PROPOFOL N/A 12/01/2017   Procedure: COLONOSCOPY WITH PROPOFOL;  Surgeon: Scot Jun, MD;  Location: [facility] ENDOSCOPY;  Service: Endoscopy;  Laterality: N/A;   CYST EXCISION  1982   foot   CYSTOSCOPY W/ RETROGRADES  04/20/2021   Procedure: CYSTOSCOPY WITH RETROGRADE PYELOGRAM;  Surgeon: Sondra Come, MD;  Location: [facility] ORS;  Service: Urology;;   CYSTOSCOPY/URETEROSCOPY/HOLMIUM LASER/STENT PLACEMENT Left 04/20/2021    Procedure: CYSTOSCOPY/URETEROSCOPY/HOLMIUM LASER/STENT PLACEMENT;  Surgeon: Sondra Come, MD;  Location: [facility] ORS;  Service: Urology;  Laterality: Left;   JOINT REPLACEMENT     KNEE ARTHROPLASTY Left 04/27/2018   Procedure: COMPUTER ASSISTED TOTAL KNEE ARTHROPLASTY;  Surgeon: Donato Heinz, MD;  Location: [facility] ORS;  Service: Orthopedics;  Laterality: Left;   LITHOTRIPSY      Family History No h/o GI disease or malignancy  Review of Systems  Constitutional:  Negative for activity change, appetite change, chills, diaphoresis, fatigue, fever and unexpected weight change.  HENT:  Negative for trouble swallowing and voice change.   Respiratory:  Negative for shortness of breath and wheezing.   Cardiovascular:  Negative for chest pain, palpitations and leg swelling.  Gastrointestinal:  Negative for abdominal distention, abdominal pain, anal bleeding, blood in stool, constipation, diarrhea, nausea, rectal pain and vomiting.  Musculoskeletal:  Negative for arthralgias and myalgias.  Skin:  Negative for color change and pallor.  Neurological:  Negative for dizziness, syncope and weakness.  Psychiatric/Behavioral:  Negative for confusion.   All other systems reviewed and are negative.    Medications No current facility-administered medications on file prior to encounter.   Current Outpatient Medications on File Prior to Encounter  Medication Sig Dispense Refill   aspirin EC 81 MG tablet Take 81 mg  by mouth daily. Swallow whole.     Calcium Carb-Cholecalciferol 500-400 MG-UNIT TABS Take 1 tablet by mouth daily.     carboxymethylcellulose (REFRESH PLUS) 0.5 % SOLN Place 1 drop into both eyes 4 (four) times daily as needed (dry eyes.).      Cholecalciferol (VITAMIN D3) 5000 units TABS Take 5,000 Units by mouth every evening.     Chromium Picolinate 200 MCG TABS Take 200 mcg by mouth every evening.     clotrimazole-betamethasone (LOTRISONE) cream Apply 1 application topically 2 (two)  times daily as needed (for yeast on skin.).     Cyanocobalamin 2500 MCG SUBL Place under the tongue.     D-Mannose POWD Take 10 mLs by mouth 2 (two) times daily.      empagliflozin (JARDIANCE) 25 MG TABS tablet Take 25 mg by mouth daily.     fluconazole (DIFLUCAN) 200 MG tablet Take 200 mg by mouth every Sunday. In the morning.     insulin glargine, 2 Unit Dial, (TOUJEO MAX SOLOSTAR) 300 UNIT/ML Solostar Pen Inject 6 Units into the skin at bedtime.     insulin lispro (HUMALOG) 100 UNIT/ML injection Inject 18 Units into the skin with breakfast, with lunch, and with evening meal.     ivermectin (STROMECTOL) 3 MG TABS tablet Take 21 mg by mouth as needed. Use 2-3 times a year (take 7 tablets (21 mg) by once a week, then repeat dose once after 1 week) For skin condition     ketoconazole (NIZORAL) 2 % cream Apply 1 Application topically daily.     levothyroxine (SYNTHROID, LEVOTHROID) 25 MCG tablet Take 25 mcg by mouth daily before breakfast. On an empty stomach     loratadine (CLARITIN) 10 MG tablet Take 10 mg by mouth every morning.     losartan (COZAAR) 25 MG tablet Take by mouth.     Melatonin 5 MG TABS Take 5 mg by mouth at bedtime.     metFORMIN (GLUCOPHAGE-XR) 500 MG 24 hr tablet Take 1,000 mg by mouth 2 (two) times daily.     Multiple Vitamin (MULTIVITAMIN WITH MINERALS) TABS tablet Take 1 tablet by mouth daily. Centrum silver     nitrofurantoin (MACRODANTIN) 50 MG capsule Take 1 capsule (50 mg total) by mouth daily. 90 capsule 0   nystatin cream (MYCOSTATIN) Apply to affected area 2 times daily (Patient taking differently: Apply 1 application  topically daily as needed (yeast infection). Apply to affected area 2 times daily) 30 g 0   phenazopyridine (PYRIDIUM) 200 MG tablet Take 1 tablet (200 mg total) by mouth 3 (three) times daily. (Patient taking differently: Take 200 mg by mouth as needed for pain.) 6 tablet 0   Polyethyl Glycol-Propyl Glycol 0.4-0.3 % SOLN Place 1 drop into both eyes 4  (four) times daily as needed (for dry/irritated eyes.). Systane     Probiotic Product (DIGESTIVE ADVANTAGE PO) Take 1 capsule by mouth 2 (two) times daily.     pseudoephedrine (SUDAFED) 30 MG tablet Take 60 mg by mouth every morning.     sulfamethoxazole-trimethoprim (BACTRIM DS) 800-160 MG tablet Take 1 tablet by mouth 2 (two) times daily. 14 tablet 0   tamsulosin (FLOMAX) 0.4 MG CAPS capsule Take 1 capsule (0.4 mg total) by mouth daily after supper. 14 capsule 0   tirzepatide (MOUNJARO) 5 MG/0.5ML Pen Inject 5 mg into the skin once a week.     triamcinolone (KENALOG) 0.025 % cream Apply 1 Application topically 2 (two) times daily.  TRUEPLUS 5-BEVEL PEN NEEDLES 31G X 5 MM MISC      Turmeric POWD Take 5 mLs by mouth daily.      Vitamin D, Ergocalciferol, (DRISDOL) 50000 units CAPS capsule Take 50,000 Units by mouth every 14 (fourteen) days.     niacin (SLO-NIACIN) 500 MG tablet Take 250 mg by mouth daily with lunch.      Pertinent medications related to GI and procedure were reviewed by me with the patient prior to the procedure   Current Facility-Administered Medications:    0.9 %  sodium chloride infusion, , Intravenous, Continuous, [name], DO  sodium chloride         Allergies  Allergen Reactions   Chlorhexidine Itching and Rash    blisters   Actos [Pioglitazone]     Fluid retention    Adhesive [Tape]     Peels skin off-best to use paper tape    Ibuprofen Diarrhea and Nausea And Vomiting   Januvia [Sitagliptin]     Headache    Lisinopril     Dizziness    Statins Diarrhea    Muscle cramps     Vicodin [Hydrocodone-Acetaminophen] Nausea And Vomiting   Byetta 10 Mcg Pen [Exenatide] Rash   Olive Oil Rash    Olives & Olive oil   Peanut Oil Rash    Peanuts & peanut oil   Allergies were reviewed by me prior to the procedure  Objective   Body mass index is 54.32 kg/m. Vitals:   09/12/23 0919  BP: (!) 148/86  Pulse: (!) 109  Resp: 20  Temp: (!)  97 F (36.1 C)  TempSrc: Temporal  SpO2: 98%  Weight: 134.7 kg  Height: 5\' 2"  (1.575 m)     Physical Exam Vitals and nursing note reviewed.  Constitutional:      General: She is not in acute distress.    Appearance: Normal appearance. She is obese. She is not ill-appearing, toxic-appearing or diaphoretic.  HENT:     Head: Normocephalic and atraumatic.     Nose: Nose normal.     Mouth/Throat:     Mouth: Mucous membranes are moist.     Pharynx: Oropharynx is clear.  Eyes:     General: No scleral icterus.    Extraocular Movements: Extraocular movements intact.  Cardiovascular:     Rate and Rhythm: Regular rhythm. Tachycardia present.     Heart sounds: Normal heart sounds. No murmur heard.    No friction rub. No gallop.  Pulmonary:     Effort: Pulmonary effort is normal. No respiratory distress.     Breath sounds: Normal breath sounds. No wheezing, rhonchi or rales.  Abdominal:     General: Bowel sounds are normal. There is no distension.     Palpations: Abdomen is soft.     Tenderness: There is no abdominal tenderness. There is no guarding or rebound.  Musculoskeletal:     Cervical back: Neck supple.     Right lower leg: No edema.     Left lower leg: No edema.  Skin:    General: Skin is warm and dry.     Coloration: Skin is not jaundiced or pale.  Neurological:     General: No focal deficit present.     Mental Status: She is alert and oriented to person, place, and time. Mental status is at baseline.  Psychiatric:        Mood and Affect: Mood normal.        Behavior: Behavior normal.  Thought Content: Thought content normal.        Judgment: Judgment normal.      Assessment:  Ms. Stephanie Mathews is a 64 y.o. female  who presents today for Colonoscopy for Personal history of colon polyps .  Plan:  Colonoscopy with possible intervention today  Colonoscopy with possible biopsy, control of bleeding, polypectomy, and interventions as necessary has been  discussed with the patient/patient representative. Informed consent was obtained from the patient/patient representative after explaining the indication, nature, and risks of the procedure including but not limited to death, bleeding, perforation, missed neoplasm/lesions, cardiorespiratory compromise, and reaction to medications. Opportunity for questions was given and appropriate answers were provided. Patient/patient representative has verbalized understanding is amenable to undergoing the procedure.   [name], DO  [facility]  Portions of the record may have been created with voice recognition software. Occasional wrong-word or 'sound-a-like' substitutions may have occurred due to the inherent limitations of voice recognition software.  Read the chart carefully and recognize, using context, where substitutions may have occurred.

## 2023-09-12 NOTE — Interval H&P Note (Signed)
 History and Physical Interval Note: Preprocedure H&P from 09/12/23  was reviewed and there was no interval change after seeing and examining the patient.  Written consent was obtained from the patient after discussion of risks, benefits, and alternatives. Patient has consented to proceed with Colonoscopy with possible intervention   09/12/2023 9:40 AM  Stephanie Mathews  has presented today for surgery, with the diagnosis of Z86.0101 (ICD-10-CM) - Hx of adenomatous colonic polyps.  The various methods of treatment have been discussed with the patient and family. After consideration of risks, benefits and other options for treatment, the patient has consented to  Procedure(s): COLONOSCOPY WITH PROPOFOL (N/A) as a surgical intervention.  The patient's history has been reviewed, patient examined, no change in status, stable for surgery.  I have reviewed the patient's chart and labs.  Questions were answered to the patient's satisfaction.     [name]

## 2023-09-15 LAB — SURGICAL PATHOLOGY

## 2023-09-30 ENCOUNTER — Ambulatory Visit
Admission: RE | Admit: 2023-09-30 | Discharge: 2023-09-30 | Disposition: A | Discharge status: Home / Self Care | Source: Ambulatory Visit | Attending: Physician Assistant | Admitting: Physician Assistant

## 2023-09-30 ENCOUNTER — Ambulatory Visit (INDEPENDENT_AMBULATORY_CARE_PROVIDER_SITE_OTHER): Admitting: Physician Assistant

## 2023-09-30 ENCOUNTER — Other Ambulatory Visit: Payer: Self-pay

## 2023-09-30 ENCOUNTER — Ambulatory Visit
Admission: RE | Admit: 2023-09-30 | Discharge: 2023-09-30 | Disposition: A | Discharge status: Home / Self Care | Source: Ambulatory Visit | Attending: Physician Assistant | Admitting: *Deleted

## 2023-09-30 VITALS — BP 173/79 | HR 116 | Ht 62.0 in | Wt 300.0 lb

## 2023-09-30 DIAGNOSIS — N200 Calculus of kidney: Secondary | ICD-10-CM | POA: Diagnosis present

## 2023-09-30 DIAGNOSIS — Z87442 Personal history of urinary calculi: Secondary | ICD-10-CM | POA: Diagnosis present

## 2023-09-30 DIAGNOSIS — R109 Unspecified abdominal pain: Secondary | ICD-10-CM

## 2023-09-30 LAB — URINALYSIS, COMPLETE
Bilirubin, UA: NEGATIVE
Ketones, UA: NEGATIVE
Nitrite, UA: NEGATIVE
Protein,UA: NEGATIVE
Specific Gravity, UA: 1.01 (ref 1.005–1.030)
Urobilinogen, Ur: 0.2 mg/dL (ref 0.2–1.0)
pH, UA: 6 (ref 5.0–7.5)

## 2023-09-30 LAB — MICROSCOPIC EXAMINATION: WBC, UA: >30 /HPF — AB (ref 0–5)

## 2023-09-30 MED ORDER — CIPROFLOXACIN HCL 500 MG PO TABS: 500.0000 mg | ORAL_TABLET | Freq: Two times a day (BID) | ORAL | 0 refills | Status: DC

## 2023-09-30 NOTE — Progress Notes (Unsigned)
 09/30/2023 2:15 PM   Stephanie Mathews [birth year] [id number]  CC: Chief Complaint  Patient presents with   Nephrolithiasis   HPI: Stephanie Mathews is a 63 y.o. female with PMH diabetes on Jardiance, nephrolithiasis, and recurrent UTI previously on suppressive Macrobid who presents today for evaluation of a possible acute stone episode.   Today she reports about 1 month of left low back pain with new LLQ pain starting last week.  Both pains acutely worsened last night.  She denies fever, chills, nausea, or vomiting, but has been having constipation.  Notably, she reports a remote history of uric acid stones.  KUB today with incomplete visualization, however no radiopaque urolithiasis within the available viewing window.  In-office UA today positive for 2+ glucose, 2+ blood, and 1+ leukocytes; urine microscopy with >30 WBCs/HPF, 11-30 RBCs/HPF, and many bacteria.  I sent her from clinic for a stat CT stone study, which was notable for chronic left proximal hydroureter without obstructing stone. There is a scarred appearance of the proximal ureter at the site of her prior UPJ stone.  PMH: Past Medical History:  Diagnosis Date   Abnormal uterine bleeding    Adenomatous colon polyp    Arthritis    Asthma    no inhalers   Chronic kidney disease    stage 3   Claustrophobia    COVID-19 03/20/2021   Depression    Diabetes mellitus without complication (HCC)    type 2   Diabetic retinopathy associated with controlled type 2 diabetes mellitus (HCC)    Eczema    Headache    migraines rare   Hemorrhoid    History of kidney stones    Hyperlipidemia    Hypertension    Hypothyroidism    Kidney stone    Long-term insulin use (HCC)    Microalbuminuria    Morbid obesity with BMI of 50.0-59.9, adult (HCC)    Myocardial infarction (HCC)    pt unsure when but ekg showed previous mi   Rosacea    Urinary incontinence, mixed    Vitamin D deficiency     Surgical History: Past  Surgical History:  Procedure Laterality Date   CESAREAN SECTION  1995   COLONOSCOPY     COLONOSCOPY WITH PROPOFOL N/A 12/01/2017   Procedure: COLONOSCOPY WITH PROPOFOL;  Surgeon: Scot Jun, MD;  Location: [facility] ENDOSCOPY;  Service: Endoscopy;  Laterality: N/A;   COLONOSCOPY WITH PROPOFOL N/A 09/12/2023   Procedure: COLONOSCOPY WITH PROPOFOL;  Surgeon: [name], DO;  Location: [facility] ENDOSCOPY;  Service: Gastroenterology;  Laterality: N/A;   CYST EXCISION  1982   foot   CYSTOSCOPY W/ RETROGRADES  04/20/2021   Procedure: CYSTOSCOPY WITH RETROGRADE PYELOGRAM;  Surgeon: Sondra Come, MD;  Location: [facility] ORS;  Service: Urology;;   CYSTOSCOPY/URETEROSCOPY/HOLMIUM LASER/STENT PLACEMENT Left 04/20/2021   Procedure: CYSTOSCOPY/URETEROSCOPY/HOLMIUM LASER/STENT PLACEMENT;  Surgeon: Sondra Come, MD;  Location: [facility] ORS;  Service: Urology;  Laterality: Left;   JOINT REPLACEMENT     KNEE ARTHROPLASTY Left 04/27/2018   Procedure: COMPUTER ASSISTED TOTAL KNEE ARTHROPLASTY;  Surgeon: Donato Heinz, MD;  Location: [facility] ORS;  Service: Orthopedics;  Laterality: Left;   LITHOTRIPSY     POLYPECTOMY  09/12/2023   Procedure: POLYPECTOMY;  Surgeon: [name], DO;  Location: [facility] ENDOSCOPY;  Service: Gastroenterology;;    Home Medications:  Allergies as of 09/30/2023       Reactions   Chlorhexidine Itching, Rash   blisters   Actos [pioglitazone]  Fluid retention   Adhesive [tape]    Peels skin off-best to use paper tape   Ibuprofen Diarrhea, Nausea And Vomiting   Januvia [sitagliptin]    Headache   Lisinopril    Dizziness   Statins Diarrhea   Muscle cramps   Vicodin [hydrocodone-acetaminophen] Nausea And Vomiting   Byetta 10 Mcg Pen [exenatide] Rash   Olive Oil Rash   Olives & Olive oil   Peanut Oil Rash   Peanuts & peanut oil        Medication List        Accurate as of September 30, 2023  2:15 PM. If you have any questions, ask your nurse or doctor.           STOP taking these medications    ivermectin 3 MG Tabs tablet Commonly known as: STROMECTOL Stopped by: Carman Ching   nitrofurantoin 50 MG capsule Commonly known as: MACRODANTIN Stopped by: Carman Ching   nystatin cream Commonly known as: MYCOSTATIN Stopped by: Carman Ching   phenazopyridine 200 MG tablet Commonly known as: PYRIDIUM Stopped by: Carman Ching   sulfamethoxazole-trimethoprim 800-160 MG tablet Commonly known as: BACTRIM DS Stopped by: Carman Ching   tamsulosin 0.4 MG Caps capsule Commonly known as: FLOMAX Stopped by: Carman Ching       TAKE these medications    aspirin EC 81 MG tablet Take 81 mg by mouth daily. Swallow whole.   Calcium Carb-Cholecalciferol 500-400 MG-UNIT Tabs Take 1 tablet by mouth daily.   carboxymethylcellulose 0.5 % Soln Commonly known as: REFRESH PLUS Place 1 drop into both eyes 4 (four) times daily as needed (dry eyes.).   chromium picolinate tablet Take 200 mcg by mouth every evening.   clotrimazole-betamethasone cream Commonly known as: LOTRISONE Apply 1 application topically 2 (two) times daily as needed (for yeast on skin.).   Cyanocobalamin 2500 MCG Subl Place under the tongue.   D-Mannose Powd Take 10 mLs by mouth 2 (two) times daily.   DIGESTIVE ADVANTAGE PO Take 1 capsule by mouth 2 (two) times daily.   fluconazole 200 MG tablet Commonly known as: DIFLUCAN Take 200 mg by mouth every Sunday. In the morning.   insulin lispro 100 UNIT/ML injection Commonly known as: HUMALOG Inject 18 Units into the skin with breakfast, with lunch, and with evening meal.   Jardiance 25 MG Tabs tablet Generic drug: empagliflozin Take 25 mg by mouth daily.   ketoconazole 2 % cream Commonly known as: NIZORAL Apply 1 Application topically daily.   levothyroxine 25 MCG tablet Commonly known as: SYNTHROID Take 25 mcg by mouth daily before breakfast. On an empty  stomach   loratadine 10 MG tablet Commonly known as: CLARITIN Take 10 mg by mouth every morning.   losartan 25 MG tablet Commonly known as: COZAAR Take by mouth.   melatonin 5 MG Tabs Take 5 mg by mouth at bedtime.   metFORMIN 500 MG 24 hr tablet Commonly known as: GLUCOPHAGE-XR Take 1,000 mg by mouth 2 (two) times daily.   Mounjaro 5 MG/0.5ML Pen Generic drug: tirzepatide Inject 5 mg into the skin once a week.   multivitamin with minerals Tabs tablet Take 1 tablet by mouth daily. Centrum silver   niacin 500 MG tablet Commonly known as: (VITAMIN B3) Take 250 mg by mouth daily with lunch.   Polyethyl Glycol-Propyl Glycol 0.4-0.3 % Soln Place 1 drop into both eyes 4 (four) times daily as needed (for dry/irritated eyes.). Systane   pseudoephedrine 30 MG tablet Commonly known as:  SUDAFED Take 60 mg by mouth every morning.   Toujeo Max SoloStar 300 UNIT/ML Solostar Pen Generic drug: insulin glargine (2 Unit Dial) Inject 6 Units into the skin at bedtime.   triamcinolone 0.025 % cream Commonly known as: KENALOG Apply 1 Application topically 2 (two) times daily.   TRUEplus 5-Bevel Pen Needles 31G X 5 MM Misc Generic drug: Insulin Pen Needle   Turmeric Powd Take 5 mLs by mouth daily.   Vitamin D (Ergocalciferol) 1.25 MG (50000 UNIT) Caps capsule Commonly known as: DRISDOL Take 50,000 Units by mouth every 14 (fourteen) days.   Vitamin D3 125 MCG (5000 UT) Tabs Take 5,000 Units by mouth every evening.        Allergies:  Allergies  Allergen Reactions   Chlorhexidine Itching and Rash    blisters   Actos [Pioglitazone]     Fluid retention    Adhesive [Tape]     Peels skin off-best to use paper tape    Ibuprofen Diarrhea and Nausea And Vomiting   Januvia [Sitagliptin]     Headache    Lisinopril     Dizziness    Statins Diarrhea    Muscle cramps     Vicodin [Hydrocodone-Acetaminophen] Nausea And Vomiting   Byetta 10 Mcg Pen [Exenatide] Rash   Olive  Oil Rash    Olives & Olive oil   Peanut Oil Rash    Peanuts & peanut oil    Family History: Family History  Problem Relation Age of Onset   Breast cancer Neg Hx     Social History:   reports that she has never smoked. She has never been exposed to tobacco smoke. She has never used smokeless tobacco. She reports current alcohol use. She reports that she does not use drugs.  Physical Exam: BP (!) 173/79   Pulse (!) 116   Ht 5\' 2"  (1.575 m)   Wt 300 lb (136.1 kg)   BMI 54.87 kg/m   Constitutional:  Alert and oriented, no acute distress, nontoxic appearing HEENT: [place], AT Cardiovascular: No clubbing, cyanosis, or edema Respiratory: Normal respiratory effort, no increased work of breathing Skin: No rashes, bruises or suspicious lesions Neurologic: Grossly intact, no focal deficits, moving all 4 extremities Psychiatric: Normal mood and affect  Laboratory Data: Results for orders placed or performed in visit on 09/30/23  Microscopic Examination   Collection Time: 09/30/23  1:52 PM   Urine  Result Value Ref Range   WBC, UA >30 (A) 0 - 5 /hpf   RBC, Urine 11-30 (A) 0 - 2 /hpf   Epithelial Cells (non renal) 0-10 0 - 10 /hpf   Bacteria, UA Many (A) None seen/Few  Urinalysis, Complete   Collection Time: 09/30/23  1:52 PM  Result Value Ref Range   Specific Gravity, UA 1.010 1.005 - 1.030   pH, UA 6.0 5.0 - 7.5   Color, UA Yellow Yellow   Appearance Ur Cloudy (A) Clear   Leukocytes,UA 1+ (A) Negative   Protein,UA Negative Negative/Trace   Glucose, UA 2+ (A) Negative   Ketones, UA Negative Negative   RBC, UA 2+ (A) Negative   Bilirubin, UA Negative Negative   Urobilinogen, Ur 0.2 0.2 - 1.0 mg/dL   Nitrite, UA Negative Negative   Microscopic Examination See below:    Pertinent Imaging: KUB, 09/30/2023: See Epic  CT stone study, 09/30/2023: CLINICAL DATA:  Abdominal/flank pain.  Stone suspected.   EXAM: CT ABDOMEN AND PELVIS WITHOUT CONTRAST   TECHNIQUE: Multidetector  CT imaging of  the abdomen and pelvis was performed following the standard protocol without IV contrast.   RADIATION DOSE REDUCTION: This exam was performed according to the departmental dose-optimization program which includes automated exposure control, adjustment of the mA and/or kV according to patient size and/or use of iterative reconstruction technique.   COMPARISON:  03/28/2021   FINDINGS: Lower chest: Clear lung bases. Mild cardiomegaly, without pericardial or pleural effusion.   Hepatobiliary: Moderate hepatic steatosis. Caudate and lateral segment left liver lobe enlargement are unchanged. Hepatomegaly is moderate, 19.5 cm. Normal gallbladder, without biliary ductal dilatation.   Pancreas: Normal, without mass or ductal dilatation.   Spleen: Normal in size, without focal abnormality.   Adrenals/Urinary Tract: Normal adrenal glands. Moderate bilateral renal cortical scarring and thinning. Punctate interpolar left renal collecting system calculus.   Passage or removal of previously described dominant left ureteropelvic junction calculus. Improved, mild surrounding interstitial thickening is likely due to scarring including on 31/2. No hydroureter or ureteric calculi. No bladder calculi.   Stomach/Bowel: Normal stomach, without wall thickening. Colonic stool burden suggests constipation. Normal terminal ileum and appendix. Normal small bowel.   Vascular/Lymphatic: Aortic atherosclerosis. No abdominopelvic adenopathy.   Reproductive: Normal uterus and adnexa.   Other: No significant free fluid.  No free intraperitoneal air.   Musculoskeletal: No acute osseous abnormality. Degenerative disc disease is most significant at L4-5.   IMPRESSION: 1. Left nephrolithiasis. Passage or removal of dominant ureteropelvic junction stone since 03/28/2021. Mild peripelvic/ureteric interstitial thickening in this area is improved, likely due to scarring. No distal  hydroureter. 2.  Possible constipation. 3. Moderate hepatic steatosis.  Possible mild cirrhosis. 4.  Aortic Atherosclerosis (ICD10-I70.0).     Electronically Signed   By: Jeronimo Greaves M.D.   On: 09/30/2023 15:29  I personally reviewed the images referenced above and note a small nonobstructing left renal stone and chronic proximal left hydroureter with scarred appearance.  Assessment & Plan:   1. Flank pain with history of urolithiasis (Primary) UA appears grossly infected today, no obstructing stone on CT, though she does appear to have some chronic proximal left hydroureter at the site of her prior left UPJ stone.  I do not think that this is the source of her current symptoms, as it is stable in appearance and I do not appreciate any significant perinephric stranding.  At this point, we will treat with empiric Cipro and send for culture for further evaluation.  We discussed return precautions including fevers and worsening pain.  Will plan for lab visit with UA in 2 weeks to prove resolution of microscopic hematuria. - Urinalysis, Complete - CULTURE, URINE COMPREHENSIVE - CT RENAL STONE STUDY; Future - ciprofloxacin (CIPRO) 500 MG tablet; Take 1 tablet (500 mg total) by mouth 2 (two) times daily for 7 days.  Dispense: 14 tablet; Refill: 0   Return in about 2 weeks (around 10/14/2023) for Lab visit for UA.  Carman Ching, PA-C  [facility]  [address] [place], [place] [address] [phone]

## 2023-10-02 LAB — CULTURE, URINE COMPREHENSIVE

## 2023-10-04 ENCOUNTER — Emergency Department
Admission: EM | Admit: 2023-10-04 | Discharge: 2023-10-04 | Disposition: A | Discharge status: Home / Self Care | Attending: Emergency Medicine | Admitting: Emergency Medicine

## 2023-10-04 ENCOUNTER — Encounter: Payer: Self-pay | Admitting: Intensive Care

## 2023-10-04 ENCOUNTER — Other Ambulatory Visit: Payer: Self-pay

## 2023-10-04 ENCOUNTER — Emergency Department

## 2023-10-04 DIAGNOSIS — R109 Unspecified abdominal pain: Secondary | ICD-10-CM | POA: Diagnosis present

## 2023-10-04 DIAGNOSIS — I2510 Atherosclerotic heart disease of native coronary artery without angina pectoris: Secondary | ICD-10-CM | POA: Diagnosis not present

## 2023-10-04 DIAGNOSIS — R911 Solitary pulmonary nodule: Secondary | ICD-10-CM

## 2023-10-04 DIAGNOSIS — K760 Fatty (change of) liver, not elsewhere classified: Secondary | ICD-10-CM

## 2023-10-04 DIAGNOSIS — N12 Tubulo-interstitial nephritis, not specified as acute or chronic: Secondary | ICD-10-CM | POA: Insufficient documentation

## 2023-10-04 DIAGNOSIS — Z87442 Personal history of urinary calculi: Secondary | ICD-10-CM | POA: Diagnosis not present

## 2023-10-04 LAB — COMPREHENSIVE METABOLIC PANEL
ALT: 36 U/L (ref 0–44)
AST: 29 U/L (ref 15–41)
Albumin: 3.6 g/dL (ref 3.5–5.0)
Alkaline Phosphatase: 105 U/L (ref 38–126)
Anion gap: 9 (ref 5–15)
BUN: 27 mg/dL — ABNORMAL HIGH (ref 8–23)
CO2: 21 mmol/L — ABNORMAL LOW (ref 22–32)
Calcium: 8.9 mg/dL (ref 8.9–10.3)
Chloride: 103 mmol/L (ref 98–111)
Creatinine, Ser: 1.25 mg/dL — ABNORMAL HIGH (ref 0.44–1.00)
GFR, Estimated: 49 mL/min — ABNORMAL LOW (ref >60)
Glucose, Bld: 171 mg/dL — ABNORMAL HIGH (ref 70–99)
Potassium: 3.9 mmol/L (ref 3.5–5.1)
Sodium: 133 mmol/L — ABNORMAL LOW (ref 135–145)
Total Bilirubin: 0.7 mg/dL (ref 0.0–1.2)
Total Protein: 8.1 g/dL (ref 6.5–8.1)

## 2023-10-04 LAB — CBC WITH DIFFERENTIAL/PLATELET
Abs Immature Granulocytes: 0.01 10*3/uL (ref 0.00–0.07)
Basophils Absolute: 0 10*3/uL (ref 0.0–0.1)
Basophils Relative: 1 %
Eosinophils Absolute: 0.1 10*3/uL (ref 0.0–0.5)
Eosinophils Relative: 1 %
HCT: 52.2 % — ABNORMAL HIGH (ref 36.0–46.0)
Hemoglobin: 16.9 g/dL — ABNORMAL HIGH (ref 12.0–15.0)
Immature Granulocytes: 0 %
Lymphocytes Relative: 23 %
Lymphs Abs: 1.3 10*3/uL (ref 0.7–4.0)
MCH: 30.3 pg (ref 26.0–34.0)
MCHC: 32.4 g/dL (ref 30.0–36.0)
MCV: 93.7 fL (ref 80.0–100.0)
Monocytes Absolute: 0.5 10*3/uL (ref 0.1–1.0)
Monocytes Relative: 8 %
Neutro Abs: 4 10*3/uL (ref 1.7–7.7)
Neutrophils Relative %: 67 %
Platelets: 260 10*3/uL (ref 150–400)
RBC: 5.57 MIL/uL — ABNORMAL HIGH (ref 3.87–5.11)
RDW: 14.6 % (ref 11.5–15.5)
WBC: 5.9 10*3/uL (ref 4.0–10.5)
nRBC: 0 % (ref 0.0–0.2)

## 2023-10-04 LAB — URINALYSIS, ROUTINE W REFLEX MICROSCOPIC
Bilirubin Urine: NEGATIVE
Glucose, UA: >=500 mg/dL — AB
Ketones, ur: NEGATIVE mg/dL
Nitrite: NEGATIVE
Protein, ur: NEGATIVE mg/dL
Specific Gravity, Urine: 1.016 (ref 1.005–1.030)
pH: 5 (ref 5.0–8.0)

## 2023-10-04 LAB — TROPONIN I (HIGH SENSITIVITY)
Troponin I (High Sensitivity): 14 ng/L (ref <18)
Troponin I (High Sensitivity): 11 ng/L (ref <18)

## 2023-10-04 LAB — LACTIC ACID, PLASMA
Lactic Acid, Venous: 2.7 mmol/L (ref 0.5–1.9)
Lactic Acid, Venous: 1.9 mmol/L (ref 0.5–1.9)

## 2023-10-04 LAB — D-DIMER, QUANTITATIVE: D-Dimer, Quant: 0.52 ug{FEU}/mL — ABNORMAL HIGH (ref 0.00–0.50)

## 2023-10-04 MED ORDER — KETOROLAC TROMETHAMINE 15 MG/ML IJ SOLN: 15.0000 mg | Freq: Once | INTRAMUSCULAR | Status: DC

## 2023-10-04 MED ORDER — ONDANSETRON HCL 4 MG/2ML IJ SOLN
4.0000 mg | Freq: Once | INTRAMUSCULAR | Status: AC
  Administered 2023-10-04: 4 mg via INTRAVENOUS
  Filled 2023-10-04: qty 2

## 2023-10-04 MED ORDER — IOHEXOL 350 MG/ML SOLN
100.0000 mL | Freq: Once | INTRAVENOUS | Status: AC | PRN
  Administered 2023-10-04: 100 mL via INTRAVENOUS

## 2023-10-04 MED ORDER — OXYCODONE HCL 5 MG PO TABS: 5.0000 mg | ORAL_TABLET | Freq: Once | ORAL | Status: DC

## 2023-10-04 MED ORDER — ONDANSETRON 4 MG PO TBDP: 4.0000 mg | ORAL_TABLET | Freq: Three times a day (TID) | ORAL | 0 refills | Status: AC | PRN

## 2023-10-04 MED ORDER — SODIUM CHLORIDE 0.9 % IV BOLUS
1000.0000 mL | Freq: Once | INTRAVENOUS | Status: AC
  Administered 2023-10-04: 1000 mL via INTRAVENOUS

## 2023-10-04 MED ORDER — OXYCODONE HCL 5 MG PO TABS
5.0000 mg | ORAL_TABLET | Freq: Once | ORAL | Status: AC
  Administered 2023-10-04: 5 mg via ORAL
  Filled 2023-10-04: qty 1

## 2023-10-04 MED ORDER — LIDOCAINE 5 % EX PTCH: 1.0000 | MEDICATED_PATCH | Freq: Two times a day (BID) | CUTANEOUS | 0 refills | Status: AC

## 2023-10-04 MED ORDER — GADOBUTROL 1 MMOL/ML IV SOLN
10.0000 mL | Freq: Once | INTRAVENOUS | Status: AC | PRN
  Administered 2023-10-04: 10 mL via INTRAVENOUS

## 2023-10-04 MED ORDER — OXYCODONE HCL 5 MG PO TABS: 5.0000 mg | ORAL_TABLET | Freq: Four times a day (QID) | ORAL | 0 refills | Status: AC | PRN

## 2023-10-04 MED ORDER — LIDOCAINE 5 % EX PTCH
1.0000 | MEDICATED_PATCH | CUTANEOUS | Status: DC
  Administered 2023-10-04: 1 via TRANSDERMAL
  Filled 2023-10-04: qty 1

## 2023-10-04 MED ORDER — FENTANYL CITRATE PF 50 MCG/ML IJ SOSY
75.0000 ug | PREFILLED_SYRINGE | Freq: Once | INTRAMUSCULAR | Status: AC
  Administered 2023-10-04: 75 ug via INTRAVENOUS
  Filled 2023-10-04: qty 2

## 2023-10-04 NOTE — ED Provider Notes (Signed)
 Care assumed of patient from outgoing provider.  See their note for initial history, exam and plan.  Clinical Course as of 10/04/23 1735  Sat Oct 04, 2023  1507 Presented with flank pain.  Pyelonephritis treated with cipro and returns with flank pain. CT with questionable finding of abscess? Or irregularity - MR pending.  [SM]  1733 MRI was read as no acute findings.  Benign renal cyst.  No significant hydronephrosis.  Patient had pain medication called in.  Given return precautions.  Patient has follow-up with urology. [SM]    Clinical Course User Index [SM] Corena Herter, MD     Corena Herter, MD 10/04/23 [phone]

## 2023-10-04 NOTE — Discharge Instructions (Addendum)
 Tylenol 1 g every 8 hours with lidocaine patches, oxycodone.  Take a capful of MiraLAX to prevent constipation daily.  Continue your antibiotics and call the kidney doctor to make a follow-up appointment next week and return to the ER for worsening symptoms or any other concerns  IMPRESSION: 1. No evidence of pulmonary embolism or other acute intrathoracic process. 2. No acute abdominopelvic abnormality. 3. Unchanged mural thickening of the left ureteropelvic junction, which may be related to chronic inflammation/scarring. 4. Hypoattenuating mildly exophytic focus arising from the posterior left renal upper pole measures 2.2 x 1.7 cm, incompletely characterized. Recommend further evaluation with nonemergent renal protocol MRI (preferred) or CT. 5. Multiple pulmonary nodules measuring up to 6 x 5 mm. Non-contrast chest CT at 3-6 months is recommended. If the nodules are stable at time of repeat CT, then future CT at 18-24 months (from today's scan) is considered optional for low-risk patients, but is recommended for high-risk patients. This recommendation follows the consensus statement: Guidelines for Management of Incidental Pulmonary Nodules Detected on CT Images: From the Fleischner Society 2017; Radiology 2017; 284:228-243. 6. Hepatic steatosis. 7. Aortic Atherosclerosis (ICD10-I70.0). Coronary artery calcifications. Assessment for potential risk factor modification, dietary therapy or pharmacologic therapy may be warranted, if clinically indicated.  Take oxycodone as prescribed. Do not drink alcohol, drive or participate in any other potentially dangerous activities while taking this medication as it may make you sleepy. Do not take this medication with any other sedating medications, either prescription or over-the-counter. If you were prescribed Percocet or Vicodin, do not take these with acetaminophen (Tylenol) as it is already contained within these medications.  This medication  is an opiate (or narcotic) pain medication and can be habit forming. Use it as little as possible to achieve adequate pain control. Do not use or use it with extreme caution if you have a history of opiate abuse or dependence. If you are on a pain contract with your primary care doctor or a pain specialist, be sure to let them know you were prescribed this medication today from the Emergency Department. This medication is intended for your use only - do not give any to anyone else and keep it in a secure place where nobody else, especially children, have access to it.     IMPRESSION: 1. No evidence of pulmonary embolism or other acute intrathoracic process. 2. No acute abdominopelvic abnormality. 3. Unchanged mural thickening of the left ureteropelvic junction, which may be related to chronic inflammation/scarring. 4. Hypoattenuating mildly exophytic focus arising from the posterior left renal upper pole measures 2.2 x 1.7 cm, incompletely characterized. Recommend further evaluation with nonemergent renal protocol MRI (preferred) or CT. 5. Multiple pulmonary nodules measuring up to 6 x 5 mm. Non-contrast chest CT at 3-6 months is recommended. If the nodules are stable at time of repeat CT, then future CT at 18-24 months (from today's scan) is considered optional for low-risk patients, but is recommended for high-risk patients. This recommendation follows the consensus statement: Guidelines for Management of Incidental Pulmonary Nodules Detected on CT Images: From the Fleischner Society 2017; Radiology 2017; 284:228-243. 6. Hepatic steatosis. 7. Aortic Atherosclerosis (ICD10-I70.0). Coronary artery calcifications. Assessment for potential risk factor modification, dietary therapy or pharmacologic therapy may be warranted, if clinically indicated.

## 2023-10-04 NOTE — ED Triage Notes (Signed)
 Patient presents with left sided flank pain and left, lower abdominal pain.   Recent CT abdomen 3/18  Recently placed on cipro for same pain

## 2023-10-04 NOTE — ED Notes (Signed)
 Pt ambulatory to bathroom in room.

## 2023-10-04 NOTE — ED Provider Notes (Signed)
 [facility] Provider Note    Event Date/Time   First MD Initiated Contact with Patient 10/04/23 [phone]     (approximate)   History   Flank Pain and Abdominal Pain   HPI  Stephanie Mathews is a 63 y.o. female with history of kidney stones who comes in with worsening left-sided abdominal pain.  Patient reports that she was seen by urology on 3/18.  Diagnosed with pyelonephritis and started on Cipro.  She reports that her pain had gotten better and she was starting to feel better when today she developed severe worsening of pain mostly in the left side of her abdomen.  She denies ever having pain like this previously with her prior kidney stones.  No known fevers, nausea, vomiting.  She denies taking anything at home to try to help with the pain.  She states that she was worried there was something else going on and therefore presented to the ER.  She denies any overt shortness of breath but states that because of the pain she kind of catches her breath and makes her feel short of breath.  Denies any history of blood clots, leg swelling or other concerns.  3/18: 1. Left nephrolithiasis. Passage or removal of dominant ureteropelvic junction stone since 03/28/2021. Mild peripelvic/ureteric interstitial thickening in this area is improved, likely due to scarring. No distal hydroureter.  Urine culture was positive for E. coli  Physical Exam   Triage Vital Signs: ED Triage Vitals  Encounter Vitals Group     BP 10/04/23 0830 (!) 144/94     Systolic BP Percentile --      Diastolic BP Percentile --      Pulse Rate 10/04/23 0830 (!) 114     Resp 10/04/23 0830 18     Temp 10/04/23 0830 98.3 F (36.8 C)     Temp Source 10/04/23 0830 Oral     SpO2 10/04/23 0830 99 %     Weight 10/04/23 0833 300 lb (136.1 kg)     Height 10/04/23 0833 5\' 2"  (1.575 m)     Head Circumference --      Peak Flow --      Pain Score --      Pain Loc --      Pain Education --       Exclude from Growth Chart --     Most recent vital signs: Vitals:   10/04/23 0830  BP: (!) 144/94  Pulse: (!) 114  Resp: 18  Temp: 98.3 F (36.8 C)  SpO2: 99%     General: Awake, no distress.  CV:  Good peripheral perfusion.  Tachycardic Resp:  Normal effort.  Abd:  No distention.  Tender on the left abdomen without any rebound or guarding Other:     ED Results / Procedures / Treatments   Labs (all labs ordered are listed, but only abnormal results are displayed) Labs Reviewed  CBC WITH DIFFERENTIAL/PLATELET - Abnormal; Notable for the following components:      Result Value   RBC 5.57 (*)    Hemoglobin 16.9 (*)    HCT 52.2 (*)    All other components within normal limits  COMPREHENSIVE METABOLIC PANEL - Abnormal; Notable for the following components:   Sodium 133 (*)    CO2 21 (*)    Glucose, Bld 171 (*)    BUN 27 (*)    Creatinine, Ser 1.25 (*)    GFR, Estimated 49 (*)    All other  components within normal limits  LACTIC ACID, PLASMA - Abnormal; Notable for the following components:   Lactic Acid, Venous 2.7 (*)    All other components within normal limits  D-DIMER, QUANTITATIVE - Abnormal; Notable for the following components:   D-Dimer, Quant 0.52 (*)    All other components within normal limits  URINALYSIS, ROUTINE W REFLEX MICROSCOPIC - Abnormal; Notable for the following components:   Color, Urine YELLOW (*)    APPearance HAZY (*)    Glucose, UA >=500 (*)    Hgb urine dipstick MODERATE (*)    Leukocytes,Ua TRACE (*)    Bacteria, UA RARE (*)    All other components within normal limits  CULTURE, BLOOD (ROUTINE X 2)  CULTURE, BLOOD (ROUTINE X 2)  URINE CULTURE  LACTIC ACID, PLASMA  TROPONIN I (HIGH SENSITIVITY)  TROPONIN I (HIGH SENSITIVITY)     EKG  My interpretation of EKG:  Sinus tachycardia rate of 106 that any ST elevation or T wave inversions, normal intervals  RADIOLOGY I have reviewed the CT personally and interpreted no evidence of  pulmonary embolism   PROCEDURES:  Critical Care performed: No  Procedures   MEDICATIONS ORDERED IN ED: Medications  sodium chloride 0.9 % bolus 1,000 mL (1,000 mLs Intravenous New Bag/Given 10/04/23 0906)  fentaNYL (SUBLIMAZE) injection 75 mcg (75 mcg Intravenous Given 10/04/23 0913)  ondansetron (ZOFRAN) injection 4 mg (4 mg Intravenous Given 10/04/23 0910)  iohexol (OMNIPAQUE) 350 MG/ML injection 100 mL (100 mLs Intravenous Contrast Given 10/04/23 1039)     IMPRESSION / MDM / ASSESSMENT AND PLAN / ED COURSE  I reviewed the triage vital signs and the nursing notes.   Patient's presentation is most consistent with acute presentation with potential threat to life or bodily function.   Patient comes in with tachycardia.  1 in the room her oxygen levels also desat occasionally down to 94%.  She denies any overt shortness of breath but does report some shortness of breath when she develops the left upper abdominal pain.  Will get D-dimer to evaluate for PE and blood work to evaluate for other acute pathology  UA is improving from 4 days ago.  CBC shows normal white count CMP shows slight dehydration and slight AKI patient getting 2 L of IV fluids given her elevated lactate.  Troponin was negative.  1:00 PM  1. No evidence of pulmonary embolism or other acute intrathoracic process. 2. No acute abdominopelvic abnormality. 3. Unchanged mural thickening of the left ureteropelvic junction, which may be related to chronic inflammation/scarring. 4. Hypoattenuating mildly exophytic focus arising from the posterior left renal upper pole measures 2.2 x 1.7 cm, incompletely characterized. Recommend further evaluation with nonemergent renal protocol MRI (preferred) or CT. 5. Multiple pulmonary nodules measuring up to 6 x 5 mm. Non-contrast chest CT at 3-6 months is recommended. If the nodules are stable at time of repeat CT, then future CT at 18-24 months (from today's scan) is considered  optional for low-risk patients, but is recommended for high-risk patients. This recommendation follows the consensus statement: Guidelines for Management of Incidental Pulmonary Nodules Detected on CT Images: From the Fleischner Society 2017; Radiology 2017; 284:228-243. 6. Hepatic steatosis. 7. Aortic Atherosclerosis (ICD10-I70.0). Coronary artery calcifications. Assessment for potential risk factor modification, dietary therapy or pharmacologic therapy may be warranted, if clinically indicated.  Discussed with patient incidental findings noted on CT imaging.  Patient provided copy of report.  Cardiology referral placed for coronary artery calcifications and I did discuss the repeat  CT imaging of pulmonary nodules and placed a referral.  We discussed the focus of the left renal pole.  Did discuss with the radiologist on possible differentials including infection versus cancer versus artifact.  It discussed with patient and will proceed with MRI in the ED given worsening pain over this area to ensure that there is not an abscess that would necessitate IV antibiotics and admission.  Patient did opt team pending MRI results.  If MRI is reassuring patient can be discharged home.  Prescribe patient some oxycodone to help with pain until her follow-up.  The patient is on the cardiac monitor to evaluate for evidence of arrhythmia and/or significant heart rate changes.  Clinical Course as of 10/04/23 1520  Sat Oct 04, 2023  1507 Presented with flank pain.  Pyelonephritis treated with cipro and returns with flank pain. CT with questionable finding of abscess? Or irregularity - MR pending.  [SM]    Clinical Course User Index [SM] Corena Herter, MD     FINAL CLINICAL IMPRESSION(S) / ED DIAGNOSES   Final diagnoses:  Coronary artery calcification  Pyelonephritis     Rx / DC Orders   ED Discharge Orders          Ordered    Ambulatory referral to Cardiology        10/04/23 1207     oxyCODONE (ROXICODONE) 5 MG immediate release tablet  Every 6 hours PRN        10/04/23 1530    ondansetron (ZOFRAN-ODT) 4 MG disintegrating tablet  Every 8 hours PRN        10/04/23 1530    lidocaine (LIDODERM) 5 %  Every 12 hours        10/04/23 1530             Note:  This document was prepared using Dragon voice recognition software and may include unintentional dictation errors.   Concha Se, MD 10/04/23 [phone]

## 2023-10-05 LAB — URINE CULTURE: Culture: NO GROWTH

## 2023-10-06 DIAGNOSIS — R109 Unspecified abdominal pain: Secondary | ICD-10-CM

## 2023-10-06 MED ORDER — CIPROFLOXACIN HCL 500 MG PO TABS: 500.0000 mg | ORAL_TABLET | Freq: Two times a day (BID) | ORAL | 0 refills | Status: AC

## 2023-10-09 LAB — CULTURE, BLOOD (ROUTINE X 2)
Culture: NO GROWTH
Culture: NO GROWTH
Special Requests: ADEQUATE
Special Requests: ADEQUATE

## 2023-10-14 ENCOUNTER — Ambulatory Visit: Admitting: Urology

## 2023-10-14 ENCOUNTER — Other Ambulatory Visit

## 2023-10-16 ENCOUNTER — Other Ambulatory Visit: Payer: Self-pay

## 2023-10-16 DIAGNOSIS — N390 Urinary tract infection, site not specified: Secondary | ICD-10-CM

## 2023-10-16 DIAGNOSIS — N200 Calculus of kidney: Secondary | ICD-10-CM

## 2023-10-20 ENCOUNTER — Other Ambulatory Visit

## 2023-10-20 DIAGNOSIS — N390 Urinary tract infection, site not specified: Secondary | ICD-10-CM

## 2023-10-20 DIAGNOSIS — N200 Calculus of kidney: Secondary | ICD-10-CM

## 2023-10-20 LAB — URINALYSIS, COMPLETE
Bilirubin, UA: NEGATIVE
Ketones, UA: NEGATIVE
Leukocytes,UA: NEGATIVE
Nitrite, UA: NEGATIVE
Protein,UA: NEGATIVE
Specific Gravity, UA: 1.01 (ref 1.005–1.030)
Urobilinogen, Ur: 0.2 mg/dL (ref 0.2–1.0)
pH, UA: 6 (ref 5.0–7.5)

## 2023-10-20 LAB — MICROSCOPIC EXAMINATION

## 2023-10-22 ENCOUNTER — Encounter: Payer: Self-pay | Admitting: Pulmonary Disease

## 2023-10-22 ENCOUNTER — Telehealth: Payer: Self-pay

## 2023-10-22 ENCOUNTER — Ambulatory Visit: Admitting: Pulmonary Disease

## 2023-10-22 VITALS — BP 152/90 | HR 113 | Temp 96.9°F | Ht 62.0 in | Wt 300.0 lb

## 2023-10-22 DIAGNOSIS — R918 Other nonspecific abnormal finding of lung field: Secondary | ICD-10-CM | POA: Diagnosis not present

## 2023-10-22 DIAGNOSIS — R0600 Dyspnea, unspecified: Secondary | ICD-10-CM | POA: Diagnosis not present

## 2023-10-22 DIAGNOSIS — R911 Solitary pulmonary nodule: Secondary | ICD-10-CM

## 2023-10-22 DIAGNOSIS — Z8616 Personal history of COVID-19: Secondary | ICD-10-CM | POA: Diagnosis not present

## 2023-10-22 MED ORDER — BUDESONIDE-FORMOTEROL FUMARATE 160-4.5 MCG/ACT IN AERO: 2.0000 | INHALATION_SPRAY | Freq: Two times a day (BID) | RESPIRATORY_TRACT | 12 refills | Status: DC

## 2023-10-22 NOTE — Progress Notes (Signed)
 Synopsis: Referred in by Concha Se, MD   Subjective:   PATIENT ID: Stephanie Mathews GENDER: female DOB: [birth year], MRN: [id number]  Chief Complaint  Patient presents with   Consult    Covid 2022. SOB since Covid. Wheezing. No cough.    HPI Stephanie Mathews is a pleasant 63 year old female patient with a past medical history of morbid obesity, type 2 diabetes mellitus, hypothyroidism hypertension and hyperlipidemia presenting today to the pulmonary clinic for further evaluation of pulmonary nodules.  She presented to the emergency department on 22 March for nephrolithiasis.  As part of the workup she had a CT chest done that showed multiple pulmonary nodules largest measuring 6 x 5 mm.  She also reports that since COVID-19 in September 2022 she started having worsening shortness of breath specifically on exertion with a feeling of chest tightness.  No wheezing but this can happen if she has an episode of allergic rhinitis.  She does report eczema.  She also reports loud snoring and naps during the day.  Family history -no family history of pulmonary diseases.  Social history -never smoker -no alcohol use -lives at home with her husband.  She has 4 dogs and 2 cats.   ROS All systems were reviewed and are negative except for the above.  Objective:   Vitals:   10/22/23 1109  BP: (!) 152/90  Pulse: (!) 113  Temp: (!) 96.9 F (36.1 C)  SpO2: 96%  Weight: 300 lb (136.1 kg)  Height: 5\' 2"  (1.575 m)   96% on RA BMI Readings from Last 3 Encounters:  10/22/23 54.87 kg/m  10/04/23 54.87 kg/m  09/30/23 54.87 kg/m   Wt Readings from Last 3 Encounters:  10/22/23 300 lb (136.1 kg)  10/04/23 300 lb (136.1 kg)  09/30/23 300 lb (136.1 kg)    Physical Exam GEN: NAD, obese HEENT: Supple Neck, Reactive Pupils, EOMI  CVS: Normal S1, Normal S2, RRR, No murmurs or ES appreciated  Lungs: Clear bilateral air entry.  Abdomen: Soft, non tender, non distended, + BS  Extremities: Warm  and well perfused, No edema  Skin: No suspicious lesions appreciated  Psych: Normal Affect  Ancillary Information   CBC    Component Value Date/Time   WBC 5.9 10/04/2023 0916   RBC 5.57 (H) 10/04/2023 0916   HGB 16.9 (H) 10/04/2023 0916   HCT 52.2 (H) 10/04/2023 0916   PLT 260 10/04/2023 0916   MCV 93.7 10/04/2023 0916   MCH 30.3 10/04/2023 0916   MCHC 32.4 10/04/2023 0916   RDW 14.6 10/04/2023 0916   LYMPHSABS 1.3 10/04/2023 0916   MONOABS 0.5 10/04/2023 0916   EOSABS 0.1 10/04/2023 0916   BASOSABS 0.0 10/04/2023 0916    Labs and imaging were reviewed     No data to display           Assessment & Plan:  Rodnesha is a pleasant 63 year old female patient with a past medical history of morbid obesity, type 2 diabetes mellitus, hypothyroidism hypertension and hyperlipidemia presenting today to the pulmonary clinic for further evaluation of pulmonary nodules.  # Pulmonary nodules largest measuring 6 mm. She is low risk and has no significant risk factors.  []  Repeat CT chest in 6 months.  # Shortness of breath Suspecting long-haul COVID.  []  PFTs []  Start budesonide-formoterol [Symbicort] 160-4.52 puffs twice daily.  Return in about 3 months (around 01/21/2024).  I spent 60 minutes caring for this patient today, including preparing to see the patient, obtaining a medical  history , reviewing a separately obtained history, performing a medically appropriate examination and/or evaluation, counseling and educating the patient/family/caregiver, ordering medications, tests, or procedures, documenting clinical information in the electronic health record, and independently interpreting results (not separately reported/billed) and communicating results to the patient/family/caregiver  Stephanie Colonel, MD [facility] 10/22/2023 7:52 PM

## 2023-10-22 NOTE — Telephone Encounter (Signed)
 Copied from CRM [id number]. Topic: Clinical - Prescription Issue >> Oct 22, 2023 12:49 PM Brennan Bailey S wrote: Reason for CRM: mandy from [facility] is calling to let doctor that quantity is wrong, it should be 10.2.  [facility] - [place], [place] - [address] [place] [address] Phone: [phone] Fax: [phone]

## 2023-10-23 ENCOUNTER — Telehealth: Payer: Self-pay | Admitting: Physician Assistant

## 2023-10-23 ENCOUNTER — Telehealth: Payer: Self-pay

## 2023-10-23 MED ORDER — BUDESONIDE-FORMOTEROL FUMARATE 160-4.5 MCG/ACT IN AERO: 2.0000 | INHALATION_SPRAY | Freq: Two times a day (BID) | RESPIRATORY_TRACT | 12 refills | Status: DC

## 2023-10-23 NOTE — Telephone Encounter (Signed)
 Patient called this morning saying that she received a call yesterday at 5:10 pm  but she could not get to the phone in time to answer. She thinks it might have been about the urinalysis that she did on the 7th. She would like to know what the call was pertaining.

## 2023-10-23 NOTE — Telephone Encounter (Signed)
Returned pts call. See lab result note.

## 2023-10-23 NOTE — Telephone Encounter (Signed)
 Copied from CRM [id number]. Topic: Clinical - Prescription Issue >> Oct 23, 2023 12:59 PM Para March B wrote: Reason for CRM: Patient saw Dr Rosine Beat on yesterday. Inhaler (budesonide-formoterol (SYMBICORT) 160-4.5 MCG/ACT inhaler)  was prescribed, however, the patient states the cost is more that they can afford. Would like Dr Rosine Beat to order his second option that he talked about at visit.

## 2023-10-23 NOTE — Telephone Encounter (Signed)
 I have sent in the Symbicort with the correct quantity.  Nothing further needed.

## 2023-10-25 MED ORDER — FLUTICASONE-SALMETEROL 115-21 MCG/ACT IN AERO: 2.0000 | INHALATION_SPRAY | Freq: Two times a day (BID) | RESPIRATORY_TRACT | 12 refills | Status: DC

## 2023-10-25 NOTE — Addendum Note (Signed)
 Addended by: Annitta Kindler on: 10/25/2023 03:08 PM   Modules accepted: Orders

## 2023-10-27 NOTE — Telephone Encounter (Signed)
 I have notified the patient. Nothing further needed.

## 2023-10-28 ENCOUNTER — Telehealth: Payer: Self-pay

## 2023-10-28 DIAGNOSIS — R911 Solitary pulmonary nodule: Secondary | ICD-10-CM

## 2023-10-28 MED ORDER — FLUTICASONE-SALMETEROL 115-21 MCG/ACT IN AERO: 2.0000 | INHALATION_SPRAY | Freq: Two times a day (BID) | RESPIRATORY_TRACT | 12 refills | Status: DC

## 2023-10-28 NOTE — Telephone Encounter (Signed)
 Copied from CRM [id number]. Topic: Clinical - Prescription Issue >> Oct 27, 2023 11:03 AM Margarette Shawl wrote: Reason for CRM:   Stephanie Mathews, with [facility], is calling in reference to fluticasone-salmeterol (ADVAIR HFA) 115-21 MCG/ACT inhaler.   A refill request was sent to them; however, the quantity was incorrect. The prescription says quantity of 1; however, a new prescription will need to be sent saying quantity of 12.   CB#  [phone]  This has been sent in.  Nothing else further needed.

## 2023-10-28 NOTE — Telephone Encounter (Signed)
 Copied from CRM [id number]. Topic: Clinical - Prescription Issue >> Oct 28, 2023 12:42 PM Juliaette Ober wrote: Reason for CRM: Mandy from [facility] calling for fluticasone-salmeterol (ADVAIR HFA) 115-21 MCG/ACT inhaler, stating they called yesterday to get the dispense quantity updated to 12 for insurance purposes but have not heard anything back. Please call at [phone]

## 2023-10-28 NOTE — Telephone Encounter (Signed)
Order has been updated. Nothing further needed.

## 2023-10-29 NOTE — Telephone Encounter (Signed)
Appt scheduled for tomorrow at 9:40

## 2023-10-30 ENCOUNTER — Ambulatory Visit (INDEPENDENT_AMBULATORY_CARE_PROVIDER_SITE_OTHER): Admitting: Physician Assistant

## 2023-10-30 ENCOUNTER — Encounter: Payer: Self-pay | Admitting: Physician Assistant

## 2023-10-30 VITALS — BP 147/86 | HR 114 | Ht 62.0 in | Wt 300.8 lb

## 2023-10-30 DIAGNOSIS — R109 Unspecified abdominal pain: Secondary | ICD-10-CM

## 2023-10-30 LAB — URINALYSIS, COMPLETE
Bilirubin, UA: NEGATIVE
Ketones, UA: NEGATIVE
Nitrite, UA: NEGATIVE
Protein,UA: NEGATIVE
Specific Gravity, UA: 1.015 (ref 1.005–1.030)
Urobilinogen, Ur: 0.2 mg/dL (ref 0.2–1.0)
pH, UA: 6 (ref 5.0–7.5)

## 2023-10-30 LAB — MICROSCOPIC EXAMINATION
RBC, Urine: >30 /HPF — AB (ref 0–2)
WBC, UA: >30 /HPF — AB (ref 0–5)

## 2023-10-30 MED ORDER — SULFAMETHOXAZOLE-TRIMETHOPRIM 800-160 MG PO TABS
1.0000 | ORAL_TABLET | Freq: Two times a day (BID) | ORAL | 0 refills | Status: AC
Start: 2023-10-30 — End: 2023-11-09

## 2023-10-30 NOTE — Progress Notes (Signed)
 10/30/2023 10:34 AM   Stephanie Mathews [birth year] [id number]  CC: Chief Complaint  Patient presents with   Other   HPI: Stephanie Mathews is a 63 y.o. female with PMH diabetes on Jardiance, uric acid nephrolithiasis, and recurrent UTI previously on suppressive Macrobid who presents today for follow-up.   I saw her in clinic most recently on 09/30/2023, at which point she describes 1 month of left low back pain and new LLQ pain.  Her UA appeared grossly positive.  Stat CT stone study showed chronic left proximal hydroureter without obstructing stone as well as a scarred appearance of the proximal ureter at the site of her prior UPJ stone.  I put her on empiric Cipro and her culture grew pansensitive E. coli.  She subsequently presented to the ED 4 days later with worsening left-sided abdominal pain.  CTAP with contrast showed no acute urologic changes, but there was a small hypoattenuating lesion on the left upper pole.  MRI abdomen with and without contrast further characterized this as benign.  Today she reports ongoing stabbing/throbbing in her left flank that wrap around to her left mid abdomen but does not cross the midline. She can have some cutaneous tingling with brushing things against her skin in that area. No numbness or rashes. She can have some sharp pains with pending over. She denies fever, chills, nausea, vomiting, or dysuria.  In-office catheterized UA today positive for 3+ glucose, 3+ blood, and 1+ leukocytes; urine microscopy with >30 WBCs/HPF, >30 RBCs/HPF, moderate bacteria, and yeast.  PMH: Past Medical History:  Diagnosis Date   Abnormal uterine bleeding    Adenomatous colon polyp    Arthritis    Asthma    no inhalers   Chronic kidney disease    stage 3   Claustrophobia    COVID-19 03/20/2021   Depression    Diabetes mellitus without complication (HCC)    type 2   Diabetic retinopathy associated with controlled type 2 diabetes mellitus (HCC)     Eczema    Headache    migraines rare   Hemorrhoid    History of kidney stones    Hyperlipidemia    Hypertension    Hypothyroidism    Kidney stone    Long-term insulin use (HCC)    Microalbuminuria    Morbid obesity with BMI of 50.0-59.9, adult (HCC)    Myocardial infarction (HCC)    pt unsure when but ekg showed previous mi   Rosacea    Urinary incontinence, mixed    Vitamin D deficiency     Surgical History: Past Surgical History:  Procedure Laterality Date   CESAREAN SECTION  1995   COLONOSCOPY     COLONOSCOPY WITH PROPOFOL N/A 12/01/2017   Procedure: COLONOSCOPY WITH PROPOFOL;  Surgeon: Scot Jun, MD;  Location: [facility] ENDOSCOPY;  Service: Endoscopy;  Laterality: N/A;   COLONOSCOPY WITH PROPOFOL N/A 09/12/2023   Procedure: COLONOSCOPY WITH PROPOFOL;  Surgeon: [name], DO;  Location: [facility] ENDOSCOPY;  Service: Gastroenterology;  Laterality: N/A;   CYST EXCISION  1982   foot   CYSTOSCOPY W/ RETROGRADES  04/20/2021   Procedure: CYSTOSCOPY WITH RETROGRADE PYELOGRAM;  Surgeon: Sondra Come, MD;  Location: [facility] ORS;  Service: Urology;;   CYSTOSCOPY/URETEROSCOPY/HOLMIUM LASER/STENT PLACEMENT Left 04/20/2021   Procedure: CYSTOSCOPY/URETEROSCOPY/HOLMIUM LASER/STENT PLACEMENT;  Surgeon: Sondra Come, MD;  Location: [facility] ORS;  Service: Urology;  Laterality: Left;   JOINT REPLACEMENT     KNEE ARTHROPLASTY Left 04/27/2018   Procedure: COMPUTER  ASSISTED TOTAL KNEE ARTHROPLASTY;  Surgeon: Donato Heinz, MD;  Location: [facility] ORS;  Service: Orthopedics;  Laterality: Left;   LITHOTRIPSY     POLYPECTOMY  09/12/2023   Procedure: POLYPECTOMY;  Surgeon: [name], DO;  Location: [facility] ENDOSCOPY;  Service: Gastroenterology;;    Home Medications:  Allergies as of 10/30/2023       Reactions   Chlorhexidine Itching, Rash   blisters   Actos [pioglitazone]    Fluid retention   Adhesive [tape]    Peels skin off-best to use paper tape   Ibuprofen Diarrhea,  Nausea And Vomiting   Januvia [sitagliptin]    Headache   Lisinopril    Dizziness   Statins Diarrhea   Muscle cramps   Vicodin [hydrocodone-acetaminophen] Nausea And Vomiting   Byetta 10 Mcg Pen [exenatide] Rash   Olive Oil Rash   Olives & Olive oil   Peanut Oil Rash   Peanuts & peanut oil        Medication List        Accurate as of October 30, 2023 10:34 AM. If you have any questions, ask your nurse or doctor.          STOP taking these medications    Mounjaro 5 MG/0.5ML Pen Generic drug: tirzepatide Stopped by: Carman Ching       TAKE these medications    aspirin EC 81 MG tablet Take 81 mg by mouth daily. Swallow whole.   Calcium Carb-Cholecalciferol 500-400 MG-UNIT Tabs Take 1 tablet by mouth daily.   carboxymethylcellulose 0.5 % Soln Commonly known as: REFRESH PLUS Place 1 drop into both eyes 4 (four) times daily as needed (dry eyes.).   chromium picolinate tablet Take 200 mcg by mouth every evening.   Cyanocobalamin 2500 MCG Subl Place under the tongue.   D-Mannose Powd Take 10 mLs by mouth 2 (two) times daily.   DIGESTIVE ADVANTAGE PO Take 1 capsule by mouth 2 (two) times daily.   fluconazole 200 MG tablet Commonly known as: DIFLUCAN Take 200 mg by mouth every Sunday. In the morning.   fluticasone-salmeterol 115-21 MCG/ACT inhaler Commonly known as: Advair HFA Inhale 2 puffs into the lungs 2 (two) times daily.   insulin lispro 100 UNIT/ML injection Commonly known as: HUMALOG Inject 18 Units into the skin with breakfast, with lunch, and with evening meal.   Jardiance 25 MG Tabs tablet Generic drug: empagliflozin Take 25 mg by mouth daily.   ketoconazole 2 % cream Commonly known as: NIZORAL Apply 1 Application topically as needed.   levothyroxine 25 MCG tablet Commonly known as: SYNTHROID Take 25 mcg by mouth daily before breakfast. On an empty stomach   loratadine 10 MG tablet Commonly known as: CLARITIN Take 10 mg by  mouth every morning.   losartan 25 MG tablet Commonly known as: COZAAR Take 12.5 mg by mouth daily.   melatonin 5 MG Tabs Take 2.5 mg by mouth at bedtime.   metFORMIN 500 MG 24 hr tablet Commonly known as: GLUCOPHAGE-XR Take 1,000 mg by mouth 2 (two) times daily.   multivitamin with minerals Tabs tablet Take 1 tablet by mouth daily. Centrum silver   niacin 500 MG tablet Commonly known as: (VITAMIN B3) Take 250 mg by mouth daily with lunch.   Polyethyl Glycol-Propyl Glycol 0.4-0.3 % Soln Place 1 drop into both eyes 4 (four) times daily as needed (for dry/irritated eyes.). Systane   pseudoephedrine 30 MG tablet Commonly known as: SUDAFED Take 60 mg by mouth every morning.  Toujeo Max SoloStar 300 UNIT/ML Solostar Pen Generic drug: insulin glargine (2 Unit Dial) Inject 62 Units into the skin at bedtime.   triamcinolone 0.025 % cream Commonly known as: KENALOG Apply 1 Application topically as needed.   TRUEplus 5-Bevel Pen Needles 31G X 5 MM Misc Generic drug: Insulin Pen Needle   Turmeric Powd Take 5 mLs by mouth daily.   Vitamin D (Ergocalciferol) 1.25 MG (50000 UNIT) Caps capsule Commonly known as: DRISDOL Take 50,000 Units by mouth every 14 (fourteen) days.   Vitamin D3 125 MCG (5000 UT) Tabs Take 5,000 Units by mouth every evening.        Allergies:  Allergies  Allergen Reactions   Chlorhexidine Itching and Rash    blisters   Actos [Pioglitazone]     Fluid retention    Adhesive [Tape]     Peels skin off-best to use paper tape    Ibuprofen Diarrhea and Nausea And Vomiting   Januvia [Sitagliptin]     Headache    Lisinopril     Dizziness    Statins Diarrhea    Muscle cramps     Vicodin [Hydrocodone-Acetaminophen] Nausea And Vomiting   Byetta 10 Mcg Pen [Exenatide] Rash   Olive Oil Rash    Olives & Olive oil   Peanut Oil Rash    Peanuts & peanut oil    Family History: Family History  Problem Relation Age of Onset   Breast cancer Neg  Hx     Social History:   reports that she has never smoked. She has never been exposed to tobacco smoke. She has never used smokeless tobacco. She reports current alcohol use. She reports that she does not use drugs.  Physical Exam: BP (!) 147/86   Pulse (!) 114   Ht 5\' 2"  (1.575 m)   Wt (!) 300 lb 12.8 oz (136.4 kg)   BMI 55.02 kg/m   Constitutional:  Alert and oriented, no acute distress, nontoxic appearing HEENT: [place], AT Cardiovascular: No clubbing, cyanosis, or edema Respiratory: Normal respiratory effort, no increased work of breathing Skin: No rashes, bruises or suspicious lesions Neurologic: Grossly intact, no focal deficits, moving all 4 extremities Psychiatric: Normal mood and affect  Laboratory Data: Results for orders placed or performed in visit on 10/30/23  Microscopic Examination   Collection Time: 10/30/23 10:17 AM   Urine  Result Value Ref Range   WBC, UA >30 (A) 0 - 5 /hpf   RBC, Urine >30 (A) 0 - 2 /hpf   Epithelial Cells (non renal) 0-10 0 - 10 /hpf   Mucus, UA Present (A) Not Estab.   Bacteria, UA Moderate (A) None seen/Few   Yeast, UA Present (A) None seen  Urinalysis, Complete   Collection Time: 10/30/23 10:17 AM  Result Value Ref Range   Specific Gravity, UA 1.015 1.005 - 1.030   pH, UA 6.0 5.0 - 7.5   Color, UA Yellow Yellow   Appearance Ur Hazy (A) Clear   Leukocytes,UA 1+ (A) Negative   Protein,UA Negative Negative/Trace   Glucose, UA 3+ (A) Negative   Ketones, UA Negative Negative   RBC, UA 3+ (A) Negative   Bilirubin, UA Negative Negative   Urobilinogen, Ur 0.2 0.2 - 1.0 mg/dL   Nitrite, UA Negative Negative   Microscopic Examination See below:    Assessment & Plan:   1. Left flank pain (Primary) Pain appears to be somewhat dermatomal and I question radicular back pain especially with her cutaneous tingling.  Her UA appears  grossly infected, but I question if she is chronically colonized and this is just a red herring.  Will repeat a  urine culture and get a urine cytology today and start her on empiric Bactrim DS twice daily x 10 days for possible UTI, however I am also referring her to Ortho for further evaluation.  We discussed that musculoskeletal back pain and renal colic can present very similarly. - Urinalysis, Complete - CULTURE, URINE COMPREHENSIVE - sulfamethoxazole-trimethoprim (BACTRIM DS) 800-160 MG tablet; Take 1 tablet by mouth 2 (two) times daily for 10 days.  Dispense: 20 tablet; Refill: 0 - AMB referral to orthopedics   Return if symptoms worsen or fail to improve.  Kathreen Pare, PA-C  [facility] [place] [address] [place], [place] [address] [phone]

## 2023-11-04 LAB — CULTURE, URINE COMPREHENSIVE

## 2023-11-05 ENCOUNTER — Other Ambulatory Visit: Payer: Self-pay

## 2023-11-05 DIAGNOSIS — N390 Urinary tract infection, site not specified: Secondary | ICD-10-CM

## 2023-11-05 MED ORDER — FLUCONAZOLE 200 MG PO TABS: 200.0000 mg | ORAL_TABLET | Freq: Two times a day (BID) | ORAL | 0 refills | Status: AC

## 2023-12-24 ENCOUNTER — Other Ambulatory Visit: Payer: Self-pay | Admitting: Nurse Practitioner

## 2023-12-24 DIAGNOSIS — Z1231 Encounter for screening mammogram for malignant neoplasm of breast: Secondary | ICD-10-CM

## 2024-01-08 ENCOUNTER — Encounter: Payer: Self-pay | Admitting: Cardiology

## 2024-01-08 ENCOUNTER — Ambulatory Visit: Attending: Cardiology | Admitting: Cardiology

## 2024-01-08 VITALS — BP 144/84 | HR 112 | Ht 63.0 in | Wt 306.4 lb

## 2024-01-08 DIAGNOSIS — I2510 Atherosclerotic heart disease of native coronary artery without angina pectoris: Secondary | ICD-10-CM | POA: Diagnosis not present

## 2024-01-08 DIAGNOSIS — E7800 Pure hypercholesterolemia, unspecified: Secondary | ICD-10-CM

## 2024-01-08 DIAGNOSIS — R030 Elevated blood-pressure reading, without diagnosis of hypertension: Secondary | ICD-10-CM

## 2024-01-08 MED ORDER — EZETIMIBE 10 MG PO TABS: 10.0000 mg | ORAL_TABLET | Freq: Every day | ORAL | 2 refills | Status: DC

## 2024-01-08 NOTE — Patient Instructions (Signed)
 Medication Instructions:  -Start zetia 10 mg daily  *If you need a refill on your cardiac medications before your next appointment, please call your pharmacy*  Lab Work: No labs ordered today  If you have labs (blood work) drawn today and your tests are completely normal, you will receive your results only by: MyChart Message (if you have MyChart) OR A paper copy in the mail If you have any lab test that is abnormal or we need to change your treatment, we will call you to review the results.  Testing/Procedures: Your physician has requested that you have an echocardiogram. Echocardiography is a painless test that uses sound waves to create images of your heart. It provides your doctor with information about the size and shape of your heart and how well your heart's chambers and valves are working.   You may receive an ultrasound enhancing agent through an IV if needed to better visualize your heart during the echo. This procedure takes approximately one hour.  There are no restrictions for this procedure.  This will take place at [address] [facility]) [address], [place] [address]  Please note: We ask at that you not bring children with you during ultrasound (echo/ vascular) testing. Due to room size and safety concerns, children are not allowed in the ultrasound rooms during exams. Our front office staff cannot provide observation of children in our lobby area while testing is being conducted. An adult accompanying a patient to their appointment will only be allowed in the ultrasound room at the discretion of the ultrasound technician under special circumstances. We apologize for any inconvenience.   Follow-Up: At [facility], you and your health needs are our priority.  As part of our continuing mission to provide you with exceptional heart care, our providers are all part of one team.  This team includes your primary Cardiologist (physician) and Advanced Practice  Providers or APPs (Physician Assistants and Nurse Practitioners) who all work together to provide you with the care you need, when you need it.  Your next appointment:   2 month(s)  Provider:   You may see Dr. Darliss or one of the following Advanced Practice Providers on your designated Care Team:   Lonni Meager, NP Lesley Maffucci, PA-C Bernardino Bring, PA-C Cadence Madison, PA-C Tylene Lunch, NP Barnie Hila, NP    We recommend signing up for the patient portal called MyChart.  Sign up information is provided on this After Visit Summary.  MyChart is used to connect with patients for Virtual Visits (Telemedicine).  Patients are able to view lab/test results, encounter notes, upcoming appointments, etc.  Non-urgent messages can be sent to your provider as well.   To learn more about what you can do with MyChart, go to [email or web address].

## 2024-01-08 NOTE — Progress Notes (Signed)
 Cardiology Office Note:    Date:  01/08/2024   ID:  Stephanie Mathews, DOB [birth year], MRN [id number]  PCP:  Don Lauraine Collar, NP   [facility] Providers Cardiologist:  None     Referring MD: Don Lauraine Collar, *   Chief Complaint  Patient presents with   New Patient (Initial Visit)    Ref by Collar Don, NP for coronary artery calcification on CT Angio Chest.  Patient c/o racing heart beats and shortness of breath daily.     History of Present Illness:    Stephanie Mathews is a 63 y.o. female with a hx of hyperlipidemia, whitecoat syndrome, diabetes, obesity who presents due to coronary calcifications.  Patient had a chest CT 3/25 due to elevated D-dimer showing NAD and aortic root calcifications.  Denies chest pain.  Not tolerant to statins.  Takes aspirin 81 mg daily.  Tried Mounjaro in the past, stopped taking due to GI side effects.  Denies any family history of heart attacks.  Takes losartan  12.5 mg daily for renal protection.  Denies any history of hypertension.  BP adequately controlled at home with systolics in the 110s.  Past Medical History:  Diagnosis Date   Abnormal uterine bleeding    Adenomatous colon polyp    Arthritis    Asthma    no inhalers   Chronic kidney disease    stage 3   Claustrophobia    COVID-19 03/20/2021   Depression    Diabetes mellitus without complication (HCC)    type 2   Diabetic retinopathy associated with controlled type 2 diabetes mellitus (HCC)    Eczema    Headache    migraines rare   Hemorrhoid    History of kidney stones    Hyperlipidemia    Hypertension    Hypothyroidism    Kidney stone    Long-term insulin  use (HCC)    Microalbuminuria    Morbid obesity with BMI of 50.0-59.9, adult (HCC)    Myocardial infarction (HCC)    pt unsure when but ekg showed previous mi   Rosacea    Urinary incontinence, mixed    Vitamin D  deficiency     Past Surgical History:  Procedure Laterality Date    CESAREAN SECTION  1995   COLONOSCOPY     COLONOSCOPY WITH PROPOFOL  N/A 12/01/2017   Procedure: COLONOSCOPY WITH PROPOFOL ;  Surgeon: Stephanie Lamar DASEN, MD;  Location: [facility] ENDOSCOPY;  Service: Endoscopy;  Laterality: N/A;   COLONOSCOPY WITH PROPOFOL  N/A 09/12/2023   Procedure: COLONOSCOPY WITH PROPOFOL ;  Surgeon: Stephanie Elspeth Sharper, DO;  Location: [facility] ENDOSCOPY;  Service: Gastroenterology;  Laterality: N/A;   CYST EXCISION  1982   foot   CYSTOSCOPY W/ RETROGRADES  04/20/2021   Procedure: CYSTOSCOPY WITH RETROGRADE PYELOGRAM;  Surgeon: Stephanie Redell BROCKS, MD;  Location: [facility] ORS;  Service: Urology;;   CYSTOSCOPY/URETEROSCOPY/HOLMIUM LASER/STENT PLACEMENT Left 04/20/2021   Procedure: CYSTOSCOPY/URETEROSCOPY/HOLMIUM LASER/STENT PLACEMENT;  Surgeon: Stephanie Redell BROCKS, MD;  Location: [facility] ORS;  Service: Urology;  Laterality: Left;   JOINT REPLACEMENT     KNEE ARTHROPLASTY Left 04/27/2018   Procedure: COMPUTER ASSISTED TOTAL KNEE ARTHROPLASTY;  Surgeon: Stephanie Lynwood SQUIBB, MD;  Location: [facility] ORS;  Service: Orthopedics;  Laterality: Left;   LITHOTRIPSY     POLYPECTOMY  09/12/2023   Procedure: POLYPECTOMY;  Surgeon: Stephanie Elspeth Sharper, DO;  Location: [facility] ENDOSCOPY;  Service: Gastroenterology;;    Current Medications: Current Meds  Medication Sig   aspirin EC 81 MG tablet Take 81 mg  by mouth daily. Swallow whole.   Calcium  Carb-Cholecalciferol  500-400 MG-UNIT TABS Take 1 tablet by mouth daily.   carboxymethylcellulose (REFRESH PLUS) 0.5 % SOLN Place 1 drop into both eyes 4 (four) times daily as needed (dry eyes.).    Cholecalciferol  (VITAMIN D3) 5000 units TABS Take 5,000 Units by mouth every evening.   Chromium Picolinate 200 MCG TABS Take 200 mcg by mouth every evening.   Cyanocobalamin  2500 MCG SUBL Place under the tongue.   D-Mannose POWD Take 10 mLs by mouth 2 (two) times daily.    empagliflozin (JARDIANCE) 25 MG TABS tablet Take 25 mg by mouth daily.   ezetimibe (ZETIA) 10 MG tablet Take  1 tablet (10 mg total) by mouth daily.   fluconazole  (DIFLUCAN ) 200 MG tablet Take 200 mg by mouth every Sunday. In the morning.   insulin  glargine, 2 Unit Dial, (TOUJEO MAX SOLOSTAR) 300 UNIT/ML Solostar Pen Inject 62 Units into the skin at bedtime.   insulin  lispro (HUMALOG) 100 UNIT/ML injection Inject 18 Units into the skin with breakfast, with lunch, and with evening meal.   ketoconazole (NIZORAL) 2 % cream Apply 1 Application topically as needed.   levothyroxine  (SYNTHROID , LEVOTHROID) 25 MCG tablet Take 25 mcg by mouth daily before breakfast. On an empty stomach   loratadine  (CLARITIN ) 10 MG tablet Take 10 mg by mouth every morning.   losartan  (COZAAR ) 25 MG tablet Take 12.5 mg by mouth daily.   Melatonin 5 MG TABS Take 2.5 mg by mouth at bedtime.   metFORMIN  (GLUCOPHAGE -XR) 500 MG 24 hr tablet Take 1,000 mg by mouth 2 (two) times daily.   Multiple Vitamin (MULTIVITAMIN WITH MINERALS) TABS tablet Take 1 tablet by mouth daily. Centrum silver   niacin  (SLO-NIACIN ) 500 MG tablet Take 250 mg by mouth daily with lunch.   Polyethyl Glycol-Propyl Glycol 0.4-0.3 % SOLN Place 1 drop into both eyes 4 (four) times daily as needed (for dry/irritated eyes.). Systane   Probiotic Product (DIGESTIVE ADVANTAGE PO) Take 1 capsule by mouth 2 (two) times daily.   pseudoephedrine  (SUDAFED) 30 MG tablet Take 60 mg by mouth every morning.   triamcinolone (KENALOG) 0.025 % cream Apply 1 Application topically as needed.   TRUEPLUS 5-BEVEL PEN NEEDLES 31G X 5 MM MISC    Turmeric POWD Take 5 mLs by mouth daily.    Vitamin D , Ergocalciferol , (DRISDOL ) 50000 units CAPS capsule Take 50,000 Units by mouth every 14 (fourteen) days.     Allergies:   Chlorhexidine , Actos [pioglitazone], Adhesive [tape], Ibuprofen, Januvia [sitagliptin], Lisinopril, Statins, Vicodin [hydrocodone-acetaminophen ], Byetta 10 mcg pen [exenatide], Olive oil, and Peanut oil   Social History   Socioeconomic History   Marital status: Married     Spouse name: Not on file   Number of children: Not on file   Years of education: Not on file   Highest education level: Not on file  Occupational History   Not on file  Tobacco Use   Smoking status: Never    Passive exposure: Never   Smokeless tobacco: Never  Vaping Use   Vaping status: Never Used  Substance and Sexual Activity   Alcohol  use: Yes    Comment: rare   Drug use: Never   Sexual activity: Yes    Birth control/protection: Post-menopausal  Other Topics Concern   Not on file  Social History Narrative   Not on file   Social Drivers of Health   Financial Resource Strain: Low Risk  (12/24/2023)   Received from [facility]   Overall Financial Resource Strain (CARDIA)    Difficulty of Paying Living Expenses: Not hard at all  Food Insecurity: No Food Insecurity (12/24/2023)   Received from [facility]   Hunger Vital Sign    Within the past 12 months, you worried that your food would run out before you got the money to buy more.: Never true    Within the past 12 months, the food you bought just didn't last and you didn't have money to get more.: Never true  Transportation Needs: No Transportation Needs (12/24/2023)   Received from [facility] - Transportation    In the past 12 months, has lack of transportation kept you from medical appointments or from getting medications?: No    Lack of Transportation (Non-Medical): No  Physical Activity: Insufficiently Active (11/16/2020)   Received from [facility]   Exercise Vital Sign    On average, how many days per week do you engage in moderate to strenuous exercise (like a brisk walk)?: 7 days    On average, how many minutes do you engage in exercise at this level?: 20 min  Stress: No Stress Concern Present (11/16/2020)   Received from [facility] - Occupational Stress Questionnaire     Feeling of Stress : Only a little  Social Connections: Unknown (11/16/2020)   Received from [facility]   Social Connection and Isolation Panel    In a typical week, how many times do you talk on the phone with family, friends, or neighbors?: More than three times a week    How often do you get together with friends or relatives?: More than three times a week    Attends Religious Services: Not on file    Active Member of Clubs or Organizations: Not on file    Attends Banker Meetings: Not on file    Are you married, widowed, divorced, separated, never married, or living with a partner?: Married     Family History: The patient's family history includes Anemia in her sister; Anuerysm (age of onset: 29) in her mother; CVA in her mother; Heart attack in her paternal grandmother; Hyperlipidemia in her father; Hypertension in her father, mother, and sister; Parkinson's disease in her paternal grandmother. There is no history of Breast cancer.  ROS:   Please see the history of present illness.     All other systems reviewed and are negative.  EKGs/Labs/Other Studies Reviewed:    The following studies were reviewed today:  EKG Interpretation Date/Time:  Thursday January 08 2024 09:36:50 EDT Ventricular Rate:  112 PR Interval:  154 QRS Duration:  82 QT Interval:  312 QTC Calculation: 425 R Axis:   -27  Text Interpretation: Sinus tachycardia Left ventricular hypertrophy with repolarization abnormality ( R in aVL , Cornell product ) Possible Lateral infarct , age undetermined Confirmed by Darliss Rogue ([id number]) on 01/08/2024 9:50:32 AM    Recent Labs: 10/04/2023: ALT 36; BUN 27; Creatinine, Ser 1.25; Hemoglobin 16.9; Platelets 260; Potassium 3.9; Sodium 133  Recent Lipid Panel No results found for: CHOL, TRIG, HDL, CHOLHDL, VLDL, LDLCALC, LDLDIRECT  Outside lipid panel 02/26/2023 total cholesterol 291, triglyceride 175, LDL 186, HDL 70.  Risk  Assessment/Calculations:    HYPERTENSION CONTROL Vitals:   01/08/24 0929 01/08/24 0933  BP: (!) 140/80 (!) 144/84    The patient's blood pressure is elevated above target today.  In order to address the patient's elevated BP: The blood pressure is usually elevated in clinic.  Blood pressures monitored at home have been optimal.            Physical Exam:    VS:  BP (!) 144/84 (BP Location: Right Arm, Patient Position: Sitting, Cuff Size: Large)   Pulse (!) 112   Ht 5' 3 (1.6 m)   Wt (!) 306 lb 6 oz (139 kg)   SpO2 96%   BMI 54.27 kg/m     Wt Readings from Last 3 Encounters:  01/08/24 (!) 306 lb 6 oz (139 kg)  10/30/23 (!) 300 lb 12.8 oz (136.4 kg)  10/22/23 300 lb (136.1 kg)     GEN:  Well nourished, well developed in no acute distress HEENT: Normal NECK: No JVD; No carotid bruits CARDIAC: RRR, no murmurs, rubs, gallops RESPIRATORY:  Clear to auscultation without rales, wheezing or rhonchi  ABDOMEN: Soft, non-tender, non-distended MUSCULOSKELETAL:  No edema; No deformity  SKIN: Warm and dry NEUROLOGIC:  Alert and oriented x 3 PSYCHIATRIC:  Normal affect   ASSESSMENT:    1. Coronary artery calcification   2. Pure hypercholesterolemia   3. White coat syndrome without diagnosis of hypertension   4. Morbid obesity (HCC)    PLAN:    In order of problems listed above:  CAD, LAD and aortic calcifications.  Denies chest pain.  Not tolerant to statins.  Start Zetia 10 mg daily, continue aspirin 81 mg daily.  Obtain echocardiogram. Hyperlipidemia, start Zetia 10 mg daily.  Consider PCSK9 if cholesterol not adequately controlled. Whitecoat syndrome, BP well-controlled at home.  Takes losartan  for renal protection. Morbid obesity, low-calorie diet advised.  Follow-up after echocardiogram.     Medication Adjustments/Labs and Tests Ordered: Current medicines are reviewed at length with the patient today.  Concerns regarding medicines are outlined above.  Orders  Placed This Encounter  Procedures   EKG 12-Lead   ECHOCARDIOGRAM COMPLETE   Meds ordered this encounter  Medications   ezetimibe (ZETIA) 10 MG tablet    Sig: Take 1 tablet (10 mg total) by mouth daily.    Dispense:  30 each    Refill:  2    Patient Instructions  Medication Instructions:  -Start zetia 10 mg daily  *If you need a refill on your cardiac medications before your next appointment, please call your pharmacy*  Lab Work: No labs ordered today  If you have labs (blood work) drawn today and your tests are completely normal, you will receive your results only by: MyChart Message (if you have MyChart) OR A paper copy in the mail If you have any lab test that is abnormal or we need to change your treatment, we will call you to review the results.  Testing/Procedures: Your physician has requested that you have an echocardiogram. Echocardiography is a painless test that uses sound waves to create images of your heart. It provides your doctor with information about the size and shape of your heart and how well your heart's chambers and valves are working.   You may receive an ultrasound enhancing agent through an IV if needed to better visualize your heart during the echo. This procedure takes approximately one hour.  There are no restrictions for this procedure.  This will take place at [address] [facility] [address], [place] [address]  Please note: We ask at that you not bring children with you during ultrasound (echo/ vascular) testing. Due to room size and  safety concerns, children are not allowed in the ultrasound rooms during exams. Our front office staff cannot provide observation of children in our lobby area while testing is being conducted. An adult accompanying a patient to their appointment will only be allowed in the ultrasound room at the discretion of the ultrasound technician under special circumstances. We apologize for any inconvenience.    Follow-Up: At [facility], you and your health needs are our priority.  As part of our continuing mission to provide you with exceptional heart care, our providers are all part of one team.  This team includes your primary Cardiologist (physician) and Advanced Practice Providers or APPs (Physician Assistants and Nurse Practitioners) who all work together to provide you with the care you need, when you need it.  Your next appointment:   2 month(s)  Provider:   You may see Dr. Darliss or one of the following Advanced Practice Providers on your designated Care Team:   Lonni Meager, NP Lesley Maffucci, PA-C Bernardino Bring, PA-C Cadence Arcadia, PA-C Tylene Lunch, NP Barnie Hila, NP    We recommend signing up for the patient portal called MyChart.  Sign up information is provided on this After Visit Summary.  MyChart is used to connect with patients for Virtual Visits (Telemedicine).  Patients are able to view lab/test results, encounter notes, upcoming appointments, etc.  Non-urgent messages can be sent to your provider as well.   To learn more about what you can do with MyChart, go to [email or web address].         Signed, Redell Darliss, MD  01/08/2024 11:24 AM    [facility]

## 2024-01-21 ENCOUNTER — Encounter: Payer: Self-pay | Admitting: Pulmonary Disease

## 2024-01-21 ENCOUNTER — Ambulatory Visit: Admitting: Pulmonary Disease

## 2024-01-21 VITALS — BP 164/86 | HR 121 | Temp 97.3°F | Ht 62.0 in | Wt 308.0 lb

## 2024-01-21 DIAGNOSIS — R911 Solitary pulmonary nodule: Secondary | ICD-10-CM | POA: Diagnosis not present

## 2024-01-21 DIAGNOSIS — G4733 Obstructive sleep apnea (adult) (pediatric): Secondary | ICD-10-CM

## 2024-01-21 LAB — PULMONARY FUNCTION TEST
DL/VA % pred: 130 %
DL/VA: 5.59 ml/min/mmHg/L
DLCO unc % pred: 121 %
DLCO unc: 22.48 ml/min/mmHg
FEF 25-75 Post: 2.49 L/s
FEF 25-75 Pre: 1.97 L/s
FEF2575-%Change-Post: 26 %
FEF2575-%Pred-Post: 115 %
FEF2575-%Pred-Pre: 91 %
FEV1-%Change-Post: 3 %
FEV1-%Pred-Post: 90 %
FEV1-%Pred-Pre: 86 %
FEV1-Post: 2.09 L
FEV1-Pre: 2.01 L
FEV1FVC-%Change-Post: 4 %
FEV1FVC-%Pred-Pre: 100 %
FEV6-%Change-Post: 0 %
FEV6-%Pred-Post: 87 %
FEV6-%Pred-Pre: 86 %
FEV6-Post: 2.53 L
FEV6-Pre: 2.51 L
FEV6FVC-%Change-Post: 1 %
FEV6FVC-%Pred-Post: 103 %
FEV6FVC-%Pred-Pre: 102 %
FVC-%Change-Post: 0 %
FVC-%Pred-Post: 85 %
FVC-%Pred-Pre: 85 %
FVC-Post: 2.57 L
FVC-Pre: 2.58 L
Post FEV1/FVC ratio: 81 %
Post FEV6/FVC ratio: 100 %
Pre FEV1/FVC ratio: 78 %
Pre FEV6/FVC Ratio: 99 %
RV % pred: 131 %
RV: 2.53 L
TLC % pred: 107 %
TLC: 5.1 L

## 2024-01-21 NOTE — Progress Notes (Signed)
 Full PFT completed today ? ?

## 2024-01-21 NOTE — Patient Instructions (Signed)
 Full PFT completed today ? ?

## 2024-01-22 ENCOUNTER — Ambulatory Visit: Attending: Cardiology

## 2024-01-22 DIAGNOSIS — I2510 Atherosclerotic heart disease of native coronary artery without angina pectoris: Secondary | ICD-10-CM

## 2024-01-23 LAB — ECHOCARDIOGRAM COMPLETE
AR max vel: 2.25 cm2
AV Area VTI: 2.3 cm2
AV Area mean vel: 2.26 cm2
AV Mean grad: 7.5 mmHg
AV Peak grad: 13.8 mmHg
Ao pk vel: 1.86 m/s
Calc EF: 64.7 %
S' Lateral: 2.2 cm
Single Plane A2C EF: 58.4 %
Single Plane A4C EF: 70.4 %

## 2024-01-23 NOTE — Progress Notes (Signed)
 Synopsis: Referred in by Malka Domino, MD   Subjective:   PATIENT ID: Stephanie Mathews GENDER: female DOB: 08/22/1960, MRN: [id number]  Chief Complaint  Patient presents with   Follow-up    Lung nodule, and review PFT      HPI Stephanie Mathews is a pleasant 63 year old female patient with a past medical history of morbid obesity, type 2 diabetes mellitus, hypothyroidism hypertension and hyperlipidemia presenting today to the pulmonary clinic for further evaluation of pulmonary nodules.  She presented to the emergency department on 22 March for nephrolithiasis.  As part of the workup she had a CT chest done that showed multiple pulmonary nodules largest measuring 6 x 5 mm.  She also reports that since COVID-19 in September 2022 she started having worsening shortness of breath specifically on exertion with a feeling of chest tightness.  No wheezing but this can happen if she has an episode of allergic rhinitis.  She does report eczema.  She also reports loud snoring and naps during the day.  Family history -no family history of pulmonary diseases.  Social history -never smoker -no alcohol  use -lives at home with her husband.  She has 4 dogs and 2 cats.   OV 01/21/2024 - Stephanie Mathews is here to follow up on her PFT results. These show normal spirometry but decreased Peakflow with good response post bronchodilators thou does not meet critieria per ATS. Gas trapping noted and normal DLCO. Findings might indicate long haul covid or reactive airway diease. She reports improvement with Symbicort  and is compliant. Furthermore she does report loud snoring at night with fatigue during the day and is inquiring about sleep apnea.   ROS All systems were reviewed and are negative except for the above.  Objective:   Vitals:   01/21/24 1155  BP: (!) 164/86  Pulse: (!) 121  Temp: (!) 97.3 F (36.3 C)  SpO2: 94%  Weight: (!) 308 lb (139.7 kg)  Height: 5' 2 (1.575 m)   94% on RA BMI  Readings from Last 3 Encounters:  01/21/24 56.33 kg/m  01/21/24 56.48 kg/m  01/08/24 54.27 kg/m   Wt Readings from Last 3 Encounters:  01/21/24 (!) 308 lb (139.7 kg)  01/21/24 (!) 308 lb 12.8 oz (140.1 kg)  01/08/24 (!) 306 lb 6 oz (139 kg)    Physical Exam GEN: NAD, obese HEENT: Supple Neck, Reactive Pupils, EOMI  CVS: Normal S1, Normal S2, RRR, No murmurs or ES appreciated  Lungs: Clear bilateral air entry.  Abdomen: Soft, non tender, non distended, + BS  Extremities: Warm and well perfused, No edema  Skin: No suspicious lesions appreciated  Psych: Normal Affect  Ancillary Information   CBC    Component Value Date/Time   WBC 5.9 10/04/2023 0916   RBC 5.57 (H) 10/04/2023 0916   HGB 16.9 (H) 10/04/2023 0916   HCT 52.2 (H) 10/04/2023 0916   PLT 260 10/04/2023 0916   MCV 93.7 10/04/2023 0916   MCH 30.3 10/04/2023 0916   MCHC 32.4 10/04/2023 0916   RDW 14.6 10/04/2023 0916   LYMPHSABS 1.3 10/04/2023 0916   MONOABS 0.5 10/04/2023 0916   EOSABS 0.1 10/04/2023 0916   BASOSABS 0.0 10/04/2023 0916    Labs and imaging were reviewed    Latest Ref Rng & Units 01/21/2024   10:55 AM  PFT Results  FVC-Pre L 2.58  P  FVC-Predicted Pre % 85  P  FVC-Post L 2.57  P  FVC-Predicted Post % 85  P  Pre FEV1/FVC % %  78  P  Post FEV1/FCV % % 81  P  FEV1-Pre L 2.01  P  FEV1-Predicted Pre % 86  P  FEV1-Post L 2.09  P  DLCO uncorrected ml/min/mmHg 22.48  P  DLCO UNC% % 121  P  DLVA Predicted % 130  P  TLC L 5.10  P  TLC % Predicted % 107  P  RV % Predicted % 131  P    P Preliminary result     Assessment & Plan:  Aldeen is a pleasant 63 year old female patient with a past medical history of morbid obesity, type 2 diabetes mellitus, hypothyroidism hypertension and hyperlipidemia presenting today to the pulmonary clinic for further evaluation of pulmonary nodules.  # Pulmonary nodules largest measuring 6 mm. She is low risk and has no significant risk factors.  []  Repeat  CT chest in 6 months. 04/2024  #Long Haul COVID/Reactive airway diseaes.   []  Start budesonide -formoterol  [Symbicort ] 160-4.52 puffs twice daily.  #Suspecting OSA With daily fatigue, loud snoring.  []  HST.   Return in about 3 months (around 04/22/2024) for with the CT chest wo contrast .  I spent 30 minutes caring for this patient today, including preparing to see the patient, obtaining a medical history , reviewing a separately obtained history, performing a medically appropriate examination and/or evaluation, counseling and educating the patient/family/caregiver, ordering medications, tests, or procedures, documenting clinical information in the electronic health record, and independently interpreting results (not separately reported/billed) and communicating results to the patient/family/caregiver  Darrin Barn, MD [facility] 01/23/2024 9:26 AM

## 2024-01-24 ENCOUNTER — Ambulatory Visit: Payer: Self-pay | Admitting: Cardiology

## 2024-02-14 ENCOUNTER — Encounter

## 2024-02-14 DIAGNOSIS — G4733 Obstructive sleep apnea (adult) (pediatric): Secondary | ICD-10-CM

## 2024-03-01 ENCOUNTER — Telehealth: Payer: Self-pay | Admitting: Pulmonary Disease

## 2024-03-01 DIAGNOSIS — G4733 Obstructive sleep apnea (adult) (pediatric): Secondary | ICD-10-CM | POA: Diagnosis not present

## 2024-03-01 NOTE — Telephone Encounter (Signed)
 Called patient,patient would like to pause on cpap therapy due to feeling as though she did not get good sleep during test kept waking up and tugging on the cannula on her nose which was tugging on her at night making It difficult to sleep,does feel the test gave best data,informed patient I will inform doctor but most time insurance wont likely cover another test.Does no doubt a sleep issue but feel the test was comprised due to inadequate sleep.

## 2024-03-01 NOTE — Telephone Encounter (Signed)
 Call patient  Sleep study result  Date of study: 02/14/2024  Impression: Moderate obstructive sleep apnea with moderately severe oxygen desaturations AHI of 17.0, O2 nadir of 74%, saturations below 88% for 45 minutes  Recommendation: DME referral  Recommend CPAP therapy for moderate obstructive sleep apnea.  Auto-titrating CPAP with pressure settings of 5-15 should be appropriate, along with heated humidification using the patient's preferred mask.  Encourage weight loss measures.  Schedule follow-up in the office 4 to 6 weeks after initiation. of treatment

## 2024-03-02 ENCOUNTER — Telehealth: Payer: Self-pay | Admitting: Pulmonary Disease

## 2024-03-02 DIAGNOSIS — G4733 Obstructive sleep apnea (adult) (pediatric): Secondary | ICD-10-CM

## 2024-03-02 NOTE — Telephone Encounter (Unsigned)
 Copied from CRM [id number]. Topic: General - Other >> Mar 02, 2024 11:49 AM Rilla B wrote: Patient calling because she received a notification regarding her home sleep study on 8/02. Patient has already gone over results. Please call and notify patient if she does need to schedule appt. She stated this notification is a little off and was not like the others that she receives. Patient wants to make sure she is not billed for a visit on that day. Please call patient @ [phone].

## 2024-03-09 ENCOUNTER — Encounter: Payer: Self-pay | Admitting: Cardiology

## 2024-03-09 ENCOUNTER — Ambulatory Visit: Attending: Cardiology | Admitting: Cardiology

## 2024-03-09 VITALS — BP 160/95 | HR 110 | Ht 63.0 in | Wt 308.8 lb

## 2024-03-09 DIAGNOSIS — E7800 Pure hypercholesterolemia, unspecified: Secondary | ICD-10-CM | POA: Diagnosis not present

## 2024-03-09 DIAGNOSIS — I2510 Atherosclerotic heart disease of native coronary artery without angina pectoris: Secondary | ICD-10-CM

## 2024-03-09 DIAGNOSIS — R030 Elevated blood-pressure reading, without diagnosis of hypertension: Secondary | ICD-10-CM

## 2024-03-09 NOTE — Patient Instructions (Signed)
 Medication Instructions:  No changes at this time.   *If you need a refill on your cardiac medications before your next appointment, please call your pharmacy*  Lab Work: Lipid panel in 4 months. These are fasting labs so nothing to eat or drink after midnight the night before. No appointment is needed and just come to our office anytime between 8-4 they are closed daily from 1-2 for lunch.    If you have labs (blood work) drawn today and your tests are completely normal, you will receive your results only by: MyChart Message (if you have MyChart) OR A paper copy in the mail If you have any lab test that is abnormal or we need to change your treatment, we will call you to review the results.  Testing/Procedures: None  Follow-Up: At [facility], you and your health needs are our priority.  As part of our continuing mission to provide you with exceptional heart care, our providers are all part of one team.  This team includes your primary Cardiologist (physician) and Advanced Practice Providers or APPs (Physician Assistants and Nurse Practitioners) who all work together to provide you with the care you need, when you need it.  Your next appointment:   5 month(s)  Provider:   Redell Cave, MD

## 2024-03-09 NOTE — Progress Notes (Signed)
 Cardiology Office Note:    Date:  03/09/2024   ID:  Stephanie Mathews, DOB September 22, 1960, MRN [id number]  PCP:  Don Lauraine Collar, NP   [facility] Providers Cardiologist:  None     Referring MD: Don Lauraine Collar, *   Chief Complaint  Patient presents with   Follow-up    Pt doing good.    History of Present Illness:    Stephanie Mathews is a 63 y.o. female with a hx of CAD (LAD calcifications on chest CT ), hyperlipidemia, whitecoat syndrome, diabetes, obesity who presents for follow-up.  Previously seen due to LAD calcifications.  Echocardiogram obtained to evaluate any significant abnormalities.  Patient not tolerant to statins, Zetia  started which patient is tolerating.  Take losartan  for renal protection.  Denies history of hypertension.  BP at home are well-controlled with systolics in the 110s.  Prior notes/testing takes losartan  12.5 mg daily for renal protection.     Past Medical History:  Diagnosis Date   Abnormal uterine bleeding    Adenomatous colon polyp    Arthritis    Asthma    no inhalers   Chronic kidney disease    stage 3   Claustrophobia    COVID-19 03/20/2021   Depression    Diabetes mellitus without complication (HCC)    type 2   Diabetic retinopathy associated with controlled type 2 diabetes mellitus (HCC)    Eczema    Headache    migraines rare   Hemorrhoid    History of kidney stones    Hyperlipidemia    Hypertension    Hypothyroidism    Kidney stone    Long-term insulin  use (HCC)    Microalbuminuria    Morbid obesity with BMI of 50.0-59.9, adult (HCC)    Myocardial infarction (HCC)    pt unsure when but ekg showed previous mi   Rosacea    Urinary incontinence, mixed    Vitamin D  deficiency     Past Surgical History:  Procedure Laterality Date   CESAREAN SECTION  1995   COLONOSCOPY     COLONOSCOPY WITH PROPOFOL  N/A 12/01/2017   Procedure: COLONOSCOPY WITH PROPOFOL ;  Surgeon: Viktoria Lamar DASEN, MD;  Location:  [facility] ENDOSCOPY;  Service: Endoscopy;  Laterality: N/A;   COLONOSCOPY WITH PROPOFOL  N/A 09/12/2023   Procedure: COLONOSCOPY WITH PROPOFOL ;  Surgeon: Onita Elspeth Sharper, DO;  Location: [facility] ENDOSCOPY;  Service: Gastroenterology;  Laterality: N/A;   CYST EXCISION  1982   foot   CYSTOSCOPY W/ RETROGRADES  04/20/2021   Procedure: CYSTOSCOPY WITH RETROGRADE PYELOGRAM;  Surgeon: Francisca Redell BROCKS, MD;  Location: [facility] ORS;  Service: Urology;;   CYSTOSCOPY/URETEROSCOPY/HOLMIUM LASER/STENT PLACEMENT Left 04/20/2021   Procedure: CYSTOSCOPY/URETEROSCOPY/HOLMIUM LASER/STENT PLACEMENT;  Surgeon: Francisca Redell BROCKS, MD;  Location: [facility] ORS;  Service: Urology;  Laterality: Left;   JOINT REPLACEMENT     KNEE ARTHROPLASTY Left 04/27/2018   Procedure: COMPUTER ASSISTED TOTAL KNEE ARTHROPLASTY;  Surgeon: Mardee Lynwood SQUIBB, MD;  Location: [facility] ORS;  Service: Orthopedics;  Laterality: Left;   LITHOTRIPSY     POLYPECTOMY  09/12/2023   Procedure: POLYPECTOMY;  Surgeon: Onita Elspeth Sharper, DO;  Location: [facility] ENDOSCOPY;  Service: Gastroenterology;;    Current Medications: Current Meds  Medication Sig   aspirin EC 81 MG tablet Take 81 mg by mouth daily. Swallow whole.   Calcium  Carb-Cholecalciferol  500-400 MG-UNIT TABS Take 1 tablet by mouth daily.   carboxymethylcellulose (REFRESH PLUS) 0.5 % SOLN Place 1 drop into both eyes 4 (four) times daily  as needed (dry eyes.).    Cholecalciferol  (VITAMIN D3) 5000 units TABS Take 5,000 Units by mouth every evening.   Chromium Picolinate 200 MCG TABS Take 200 mcg by mouth every evening.   Cyanocobalamin  2500 MCG SUBL Place under the tongue.   D-Mannose POWD Take 10 mLs by mouth 2 (two) times daily.    ezetimibe  (ZETIA ) 10 MG tablet Take 1 tablet (10 mg total) by mouth daily.   fluconazole  (DIFLUCAN ) 200 MG tablet Take 200 mg by mouth every Sunday. In the morning.   gabapentin  (NEURONTIN ) 300 MG capsule Take 300 mg by mouth.   insulin  glargine, 2 Unit Dial, (TOUJEO MAX  SOLOSTAR) 300 UNIT/ML Solostar Pen Inject 62 Units into the skin at bedtime.   insulin  lispro (HUMALOG) 100 UNIT/ML injection Inject 18 Units into the skin with breakfast, with lunch, and with evening meal.   INSULIN  SYRINGE 1CC/29G (EXEL COMFORT POINT INSULIN  SYR) 29G X 1/2 1 ML MISC USE AS DIRECTED. 3 TIMES DAILY WITH INSULIN  LISPRO   ketoconazole (NIZORAL) 2 % cream Apply 1 Application topically as needed.   levothyroxine  (SYNTHROID , LEVOTHROID) 25 MCG tablet Take 25 mcg by mouth daily before breakfast. On an empty stomach   loratadine  (CLARITIN ) 10 MG tablet Take 10 mg by mouth every morning.   losartan  (COZAAR ) 25 MG tablet Take 12.5 mg by mouth daily.   Melatonin 5 MG TABS Take 2.5 mg by mouth at bedtime.   metFORMIN  (GLUCOPHAGE -XR) 500 MG 24 hr tablet Take 1,000 mg by mouth 2 (two) times daily.   Multiple Vitamin (MULTIVITAMIN WITH MINERALS) TABS tablet Take 1 tablet by mouth daily. Centrum silver   niacin  (SLO-NIACIN ) 500 MG tablet Take 250 mg by mouth daily with lunch.   ONETOUCH ULTRA TEST test strip SMARTSIG:Strip(s)   Polyethyl Glycol-Propyl Glycol 0.4-0.3 % SOLN Place 1 drop into both eyes 4 (four) times daily as needed (for dry/irritated eyes.). Systane   polyethylene glycol powder (GLYCOLAX/MIRALAX) 17 GM/SCOOP powder Take 119 g by mouth daily.   Probiotic Product (DIGESTIVE ADVANTAGE PO) Take 1 capsule by mouth 2 (two) times daily.   pseudoephedrine  (SUDAFED) 30 MG tablet Take 60 mg by mouth every morning.   SYMBICORT  160-4.5 MCG/ACT inhaler Inhale 2 puffs into the lungs. (Patient taking differently: Inhale 2 puffs into the lungs in the morning and at bedtime.)   triamcinolone (KENALOG) 0.025 % cream Apply 1 Application topically as needed.   TRUEPLUS 5-BEVEL PEN NEEDLES 31G X 5 MM MISC    Turmeric POWD Take 5 mLs by mouth daily.    Vitamin D , Ergocalciferol , (DRISDOL ) 50000 units CAPS capsule Take 50,000 Units by mouth every 14 (fourteen) days.     Allergies:   Chlorhexidine ,  Actos [pioglitazone], Adhesive [tape], Ibuprofen, Januvia [sitagliptin], Lisinopril, Statins, Vicodin [hydrocodone-acetaminophen ], Byetta 10 mcg pen [exenatide], Olive oil, and Peanut oil   Social History   Socioeconomic History   Marital status: Married    Spouse name: Not on file   Number of children: Not on file   Years of education: Not on file   Highest education level: Not on file  Occupational History   Not on file  Tobacco Use   Smoking status: Never    Passive exposure: Never   Smokeless tobacco: Never  Vaping Use   Vaping status: Never Used  Substance and Sexual Activity   Alcohol  use: Yes    Comment: rare   Drug use: Never   Sexual activity: Yes    Birth control/protection: Post-menopausal  Other Topics  Concern   Not on file  Social History Narrative   Not on file   Social Drivers of Health   Financial Resource Strain: Low Risk  (12/24/2023)   Received from [facility]   Overall Financial Resource Strain (CARDIA)    Difficulty of Paying Living Expenses: Not hard at all  Food Insecurity: No Food Insecurity (12/24/2023)   Received from [facility]   Hunger Vital Sign    Within the past 12 months, you worried that your food would run out before you got the money to buy more.: Never true    Within the past 12 months, the food you bought just didn't last and you didn't have money to get more.: Never true  Transportation Needs: No Transportation Needs (12/24/2023)   Received from [facility] - Transportation    In the past 12 months, has lack of transportation kept you from medical appointments or from getting medications?: No    Lack of Transportation (Non-Medical): No  Physical Activity: Insufficiently Active (11/16/2020)   Received from [facility]   Exercise Vital Sign    On average, how many days per week do you engage in moderate to strenuous exercise (like a brisk walk)?: 7 days     On average, how many minutes do you engage in exercise at this level?: 20 min  Stress: No Stress Concern Present (11/16/2020)   Received from [facility] - Occupational Stress Questionnaire    Feeling of Stress : Only a little  Social Connections: Unknown (11/16/2020)   Received from [facility]   Social Connection and Isolation Panel    In a typical week, how many times do you talk on the phone with family, friends, or neighbors?: More than three times a week    How often do you get together with friends or relatives?: More than three times a week    Attends Religious Services: Not on file    Active Member of Clubs or Organizations: Not on file    Attends Banker Meetings: Not on file    Are you married, widowed, divorced, separated, never married, or living with a partner?: Married     Family History: The patient's family history includes Anemia in her sister; Anuerysm (age of onset: 79) in her mother; CVA in her mother; Heart attack in her paternal grandmother; Hyperlipidemia in her father; Hypertension in her father, mother, and sister; Parkinson's disease in her paternal grandmother. There is no history of Breast cancer.  ROS:   Please see the history of present illness.     All other systems reviewed and are negative.  EKGs/Labs/Other Studies Reviewed:    The following studies were reviewed today:       Recent Labs: 10/04/2023: ALT 36; BUN 27; Creatinine, Ser 1.25; Hemoglobin 16.9; Platelets 260; Potassium 3.9; Sodium 133  Recent Lipid Panel No results found for: CHOL, TRIG, HDL, CHOLHDL, VLDL, LDLCALC, LDLDIRECT  Outside lipid panel 02/26/2023 total cholesterol 291, triglyceride 175, LDL 186, HDL 70.  Risk Assessment/Calculations:           Physical Exam:    VS:  BP (!) 160/95 (BP Location: Right Wrist, Patient Position: Sitting, Cuff Size: Normal)   Pulse (!) 110    Ht 5' 3 (1.6 m)   Wt (!) 308 lb 12.8 oz (140.1 kg)   SpO2 98%   BMI 54.70  kg/m     Wt Readings from Last 3 Encounters:  03/09/24 (!) 308 lb 12.8 oz (140.1 kg)  01/21/24 (!) 308 lb (139.7 kg)  01/21/24 (!) 308 lb 12.8 oz (140.1 kg)     GEN:  Well nourished, well developed in no acute distress HEENT: Normal NECK: No JVD; No carotid bruits CARDIAC: RRR, no murmurs, rubs, gallops RESPIRATORY:  Clear to auscultation without rales, wheezing or rhonchi  ABDOMEN: Soft, non-tender, non-distended MUSCULOSKELETAL:  No edema; No deformity  SKIN: Warm and dry NEUROLOGIC:  Alert and oriented x 3 PSYCHIATRIC:  Normal affect   ASSESSMENT:    1. Coronary artery calcification   2. Pure hypercholesterolemia   3. White coat syndrome without diagnosis of hypertension   4. Morbid obesity (HCC)    PLAN:    In order of problems listed above:  CAD, LAD and aortic calcifications.  Echo 7/25 EF 60 to 65%.  Denies chest pain.  Not tolerant to statins.  Continue Zetia  10 mg daily, aspirin 81 mg daily.  Hyperlipidemia, continue Zetia  10 mg daily.  Recheck lipid panel in 4 months.  Consider PCSK9 if cholesterol not adequately controlled. Whitecoat syndrome, BP well-controlled at home.  Takes losartan  for renal protection. Morbid obesity, low-calorie diet advised.  Follow-up in 5 months.     Medication Adjustments/Labs and Tests Ordered: Current medicines are reviewed at length with the patient today.  Concerns regarding medicines are outlined above.  Orders Placed This Encounter  Procedures   Lipid panel   No orders of the defined types were placed in this encounter.   Patient Instructions  Medication Instructions:  No changes at this time.   *If you need a refill on your cardiac medications before your next appointment, please call your pharmacy*  Lab Work: Lipid panel in 4 months. These are fasting labs so nothing to eat or drink after midnight the night before. No appointment is  needed and just come to our office anytime between 8-4 they are closed daily from 1-2 for lunch.    If you have labs (blood work) drawn today and your tests are completely normal, you will receive your results only by: MyChart Message (if you have MyChart) OR A paper copy in the mail If you have any lab test that is abnormal or we need to change your treatment, we will call you to review the results.  Testing/Procedures: None  Follow-Up: At [facility], you and your health needs are our priority.  As part of our continuing mission to provide you with exceptional heart care, our providers are all part of one team.  This team includes your primary Cardiologist (physician) and Advanced Practice Providers or APPs (Physician Assistants and Nurse Practitioners) who all work together to provide you with the care you need, when you need it.  Your next appointment:   5 month(s)  Provider:   Redell Cave, MD     Signed, Redell Cave, MD  03/09/2024 3:08 PM    [facility]

## 2024-04-05 ENCOUNTER — Ambulatory Visit
Admission: RE | Admit: 2024-04-05 | Discharge: 2024-04-05 | Disposition: A | Discharge status: Home / Self Care | Source: Ambulatory Visit | Attending: Pulmonary Disease | Admitting: Pulmonary Disease

## 2024-04-05 DIAGNOSIS — R911 Solitary pulmonary nodule: Secondary | ICD-10-CM | POA: Insufficient documentation

## 2024-04-07 ENCOUNTER — Other Ambulatory Visit: Payer: Self-pay | Admitting: Cardiology

## 2024-04-21 ENCOUNTER — Ambulatory Visit: Admitting: Pulmonary Disease

## 2024-04-21 ENCOUNTER — Encounter: Payer: Self-pay | Admitting: Pulmonary Disease

## 2024-04-21 VITALS — BP 136/60 | HR 108 | Temp 97.9°F | Ht 63.0 in | Wt 307.8 lb

## 2024-04-21 DIAGNOSIS — R911 Solitary pulmonary nodule: Secondary | ICD-10-CM

## 2024-04-21 DIAGNOSIS — R918 Other nonspecific abnormal finding of lung field: Secondary | ICD-10-CM | POA: Diagnosis not present

## 2024-04-21 DIAGNOSIS — U099 Post covid-19 condition, unspecified: Secondary | ICD-10-CM

## 2024-04-21 DIAGNOSIS — G4733 Obstructive sleep apnea (adult) (pediatric): Secondary | ICD-10-CM | POA: Diagnosis not present

## 2024-04-21 NOTE — Progress Notes (Signed)
 Synopsis: Referred in by Don Lauraine Collar, *   Subjective:   PATIENT ID: Stephanie Mathews GENDER: female DOB: 1960/08/31, MRN: [id number]  Chief Complaint  Patient presents with   Obstructive Sleep Apnea    HPI Stephanie Mathews is a pleasant 63 year old female patient with a past medical history of morbid obesity, type 2 diabetes mellitus, hypothyroidism hypertension and hyperlipidemia presenting today to the pulmonary clinic for further evaluation of pulmonary nodules.  She presented to the emergency department on 22 March for nephrolithiasis.  As part of the workup she had a CT chest done that showed multiple pulmonary nodules largest measuring 6 x 5 mm.  She also reports that since COVID-19 in September 2022 she started having worsening shortness of breath specifically on exertion with a feeling of chest tightness.  No wheezing but this can happen if she has an episode of allergic rhinitis.  She does report eczema.  She also reports loud snoring and naps during the day.  Family history -no family history of pulmonary diseases.  Social history -never smoker -no alcohol  use -lives at home with her husband.  She has 4 dogs and 2 cats.   OV 01/21/2024 - Stephanie Mathews is here to follow up on her PFT results. These show normal spirometry but decreased Peakflow with good response post bronchodilators thou does not meet critieria per ATS. Gas trapping noted and normal DLCO. Findings might indicate long haul covid or reactive airway diease. She reports improvement with Symbicort  and is compliant. Furthermore she does report loud snoring at night with fatigue during the day and is inquiring about sleep apnea.   OV 10.08.2025 - Stephanie Mathews is feeling better after initiating her CPAP. Compliant with symbicort . She feels she has more energy than usual. We will continue with auto CPAP 5-15 and her current inhaler regimen. We also discussed her CT chest that show stable pulmonary nodules. No further  evaluation needed.   ROS All systems were reviewed and are negative except for the above.  Objective:   Vitals:   04/21/24 1117  TempSrc: Temporal  Weight: (!) 307 lb 12.8 oz (139.6 kg)  Height: 5' 3 (1.6 m)     on RA BMI Readings from Last 3 Encounters:  04/21/24 54.52 kg/m  03/09/24 54.70 kg/m  01/21/24 56.33 kg/m   Wt Readings from Last 3 Encounters:  04/21/24 (!) 307 lb 12.8 oz (139.6 kg)  03/09/24 (!) 308 lb 12.8 oz (140.1 kg)  01/21/24 (!) 308 lb (139.7 kg)    Physical Exam GEN: NAD, obese HEENT: Supple Neck, Reactive Pupils, EOMI  CVS: Normal S1, Normal S2, RRR, No murmurs or ES appreciated  Lungs: Clear bilateral air entry.  Abdomen: Soft, non tender, non distended, + BS  Extremities: Warm and well perfused, No edema  Skin: No suspicious lesions appreciated  Psych: Normal Affect  Ancillary Information   CBC    Component Value Date/Time   WBC 5.9 10/04/2023 0916   RBC 5.57 (H) 10/04/2023 0916   HGB 16.9 (H) 10/04/2023 0916   HCT 52.2 (H) 10/04/2023 0916   PLT 260 10/04/2023 0916   MCV 93.7 10/04/2023 0916   MCH 30.3 10/04/2023 0916   MCHC 32.4 10/04/2023 0916   RDW 14.6 10/04/2023 0916   LYMPHSABS 1.3 10/04/2023 0916   MONOABS 0.5 10/04/2023 0916   EOSABS 0.1 10/04/2023 0916   BASOSABS 0.0 10/04/2023 0916    Labs and imaging were reviewed    Latest Ref Rng & Units 01/21/2024   10:55  AM  PFT Results  FVC-Pre L 2.58   FVC-Predicted Pre % 85   FVC-Post L 2.57   FVC-Predicted Post % 85   Pre FEV1/FVC % % 78   Post FEV1/FCV % % 81   FEV1-Pre L 2.01   FEV1-Predicted Pre % 86   FEV1-Post L 2.09   DLCO uncorrected ml/min/mmHg 22.48   DLCO UNC% % 121   DLVA Predicted % 130   TLC L 5.10   TLC % Predicted % 107   RV % Predicted % 131      Assessment & Plan:  Stephanie Mathews is a pleasant 63 year old female patient with a past medical history of morbid obesity, type 2 diabetes mellitus, hypothyroidism hypertension and hyperlipidemia presenting today  to the pulmonary clinic for further evaluation of pulmonary nodules.  # Pulmonary nodules largest measuring 6 mm. Stable on multiple CT chest most recent in 04/2024. No further evaluation needed.   #Long Haul COVID/Reactive airway diseaes.   []  c/w budesonide -formoterol  [Symbicort ] 160-4.52 puffs twice daily.  #Moderate OSA with AHI 17 (2025)  Resmed reviewed with good 30 days compliance > 90%. AHI has significantly improved. She feels that her fatigue improved and has more energy.   []  c/w auto cpap 5-15.   RTC 6 months.   I personally spent a total of 40 minutes in the care of the patient today including preparing to see the patient, getting/reviewing separately obtained history, performing a medically appropriate exam/evaluation, counseling and educating, documenting clinical information in the EHR, and independently interpreting results.   Darrin Barn, MD [facility] 04/21/2024 5:43 PM

## 2024-06-21 ENCOUNTER — Telehealth: Payer: Self-pay

## 2024-06-21 NOTE — Telephone Encounter (Signed)
 Noted. NFN. Pressure settings have been changed to 4-13, EPR 3

## 2024-06-21 NOTE — Telephone Encounter (Signed)
 Copied from CRM #[id number]. Topic: Clinical - Medical Advice >> Jun 21, 2024 10:07 AM Rilla NOVAK wrote: Reason for CRM: Patient states pressure on CPAP is too high.  Feels like she taking in a lot of air.  She thinks its set around 5-15.  Now she waking up gasping. Please call patient at [phone].

## 2024-07-05 ENCOUNTER — Ambulatory Visit: Admitting: Sleep Medicine

## 2024-07-26 ENCOUNTER — Encounter: Payer: Self-pay | Admitting: Sleep Medicine

## 2024-07-26 ENCOUNTER — Ambulatory Visit: Admitting: Sleep Medicine

## 2024-07-26 VITALS — BP 140/82 | HR 109 | Temp 98.8°F | Ht 63.0 in | Wt 311.8 lb

## 2024-07-26 DIAGNOSIS — G4733 Obstructive sleep apnea (adult) (pediatric): Secondary | ICD-10-CM

## 2024-07-26 DIAGNOSIS — I10 Essential (primary) hypertension: Secondary | ICD-10-CM

## 2024-07-26 NOTE — Progress Notes (Signed)
 "      Name:Stephanie Mathews MRN: [id number] DOB: [birth year]   CHIEF COMPLAINT:  CPAP F/U   HISTORY OF PRESENT ILLNESS: Stephanie Mathews is a 64 y.o. w/ a h/o OSA, hypothyroidism, asthma, DMII, HTN and morbid obesity who presents for CPAP follow up visit. Reports using CPAP therapy every night, which is confirmed by compliance data. She is currently using the Airtouch N30i nasal mask, which is comfortable. Reports feeling more refreshed upon awakening with CPAP therapy.     PAST MEDICAL HISTORY :   has a past medical history of Abnormal uterine bleeding, Adenomatous colon polyp, Arthritis, Asthma, Chronic kidney disease, Claustrophobia, COVID-19 (03/20/2021), Depression, Diabetes mellitus without complication (HCC), Diabetic retinopathy associated with controlled type 2 diabetes mellitus (HCC), Eczema, Headache, Hemorrhoid, History of kidney stones, Hyperlipidemia, Hypertension, Hypothyroidism, Kidney stone, Long-term insulin  use (HCC), Microalbuminuria, Morbid obesity with BMI of 50.0-59.9, adult (HCC), Myocardial infarction (HCC), Rosacea, Urinary incontinence, mixed, and Vitamin D  deficiency.  has a past surgical history that includes Cesarean section (1995); Colonoscopy; Colonoscopy with propofol  (N/A, 12/01/2017); Knee Arthroplasty (Left, 04/27/2018); Cyst excision (1982); Cystoscopy/ureteroscopy/holmium laser/stent placement (Left, 04/20/2021); Cystoscopy w/ retrogrades (04/20/2021); Joint replacement; Lithotripsy; Colonoscopy with propofol  (N/A, 09/12/2023); and polypectomy (09/12/2023). Prior to Admission medications  Medication Sig Start Date End Date Taking? Authorizing Provider  aspirin EC 81 MG tablet Take 81 mg by mouth daily. Swallow whole.   Yes [provider]  Blood Glucose Monitoring Suppl (CONTOUR NEXT EZ) w/Device KIT Check blood glucose 6 times daily as directed 07/20/24  Yes [provider]  Calcium  Carb-Cholecalciferol  500-400 MG-UNIT TABS Take 1 tablet by  mouth daily.   Yes [provider]  carboxymethylcellulose (REFRESH PLUS) 0.5 % SOLN Place 1 drop into both eyes 4 (four) times daily as needed (dry eyes.).    Yes [provider]  Cholecalciferol  (VITAMIN D3) 5000 units TABS Take 5,000 Units by mouth every evening.   Yes [provider]  Chromium Picolinate 200 MCG TABS Take 200 mcg by mouth every evening.   Yes [provider]  Cyanocobalamin  2500 MCG SUBL Place under the tongue.   Yes [provider]  D-Mannose POWD Take 10 mLs by mouth 2 (two) times daily.    Yes [provider]  empagliflozin (JARDIANCE) 10 MG TABS tablet Take 10 mg by mouth daily. 03/29/24  Yes [provider]  ezetimibe  (ZETIA ) 10 MG tablet TAKE 1 TABLET BY MOUTH ONCE DAILY 04/07/24  Yes Agbor-Etang, Redell, MD  fluconazole  (DIFLUCAN ) 200 MG tablet Take 200 mg by mouth every Sunday. In the morning.   Yes [provider]  insulin  glargine, 2 Unit Dial, (TOUJEO MAX SOLOSTAR) 300 UNIT/ML Solostar Pen Inject 62 Units into the skin at bedtime.   Yes [provider]  insulin  lispro (HUMALOG) 100 UNIT/ML injection Inject 18 Units into the skin with breakfast, with lunch, and with evening meal.   Yes [provider]  INSULIN  SYRINGE 1CC/29G (EXEL COMFORT POINT INSULIN  SYR) 29G X 1/2 1 ML MISC USE AS DIRECTED. 3 TIMES DAILY WITH INSULIN  LISPRO 11/19/23  Yes [provider]  ketoconazole (NIZORAL) 2 % cream Apply 1 Application topically as needed.   Yes [provider]  levothyroxine  (SYNTHROID , LEVOTHROID) 25 MCG tablet Take 25 mcg by mouth daily before breakfast. On an empty stomach   Yes [provider]  loratadine  (CLARITIN ) 10 MG tablet Take 10 mg by mouth every morning.   Yes [provider]  losartan  (COZAAR ) 25 MG tablet Take  12.5 mg by mouth daily. 04/21/21  Yes [provider]  Melatonin 5 MG TABS Take 2.5 mg by mouth at bedtime.   Yes [provider]  metFORMIN  (GLUCOPHAGE -XR) 500 MG 24 hr tablet Take 1,000 mg by mouth 2 (two) times daily.   Yes [provider]  Microlet Lancets MISC  07/21/24  Yes [provider]  Multiple Vitamin (MULTIVITAMIN WITH MINERALS) TABS tablet Take 1 tablet by mouth daily. Centrum silver   Yes [provider]  niacin  (SLO-NIACIN ) 500 MG tablet Take 250 mg by mouth daily with lunch.   Yes [provider]  AISHA FLING TEST test strip SMARTSIG:Strip(s)   Yes [provider]  Polyethyl Glycol-Propyl Glycol 0.4-0.3 % SOLN Place 1 drop into both eyes 4 (four) times daily as needed (for dry/irritated eyes.). Systane   Yes [provider]  polyethylene glycol powder (GLYCOLAX/MIRALAX) 17 GM/SCOOP powder Take 119 g by mouth daily. 09/30/23  Yes [provider]  Probiotic Product (DIGESTIVE ADVANTAGE PO) Take 1 capsule by mouth 2 (two) times daily.   Yes [provider]  pseudoephedrine  (SUDAFED) 30 MG tablet Take 60 mg by mouth every morning.   Yes [provider]  SYMBICORT  160-4.5 MCG/ACT inhaler Inhale 2 puffs into the lungs. Patient taking differently: Inhale 2 puffs into the lungs in the morning and at bedtime.   Yes [provider]  triamcinolone (KENALOG) 0.025 % cream Apply 1 Application topically as needed.   Yes [provider]  TRUEPLUS 5-BEVEL PEN NEEDLES 31G X 5 MM MISC  02/27/21  Yes [provider]  Turmeric POWD Take 5 mLs by mouth daily.    Yes [provider]  Vitamin D , Ergocalciferol , (DRISDOL ) 50000 units CAPS capsule Take 50,000 Units by mouth every 14 (fourteen) days.   Yes [provider]   Allergies[1]  FAMILY HISTORY:  family history includes Anemia in her sister and sister; Anuerysm (age of onset: 11) in her mother; Asthma in her mother; CVA in her mother; Heart attack in her paternal grandmother; Heart disease in her father and paternal grandmother;  Hyperlipidemia in her father; Hypertension in her father, mother, and sister; Parkinson's disease in her paternal grandmother. SOCIAL HISTORY:  reports that she has never smoked. She has never been exposed to tobacco smoke. She has never used smokeless tobacco. She reports current alcohol  use. She reports that she does not use drugs.   Review of Systems:  Gen:  Denies  fever, sweats, chills weight loss  HEENT: Denies blurred vision, double vision, ear pain, eye pain, hearing loss, nose bleeds, sore throat Cardiac:  No dizziness, chest pain or heaviness, chest tightness,edema, No JVD Resp:   No cough, -sputum production, -shortness of breath,-wheezing, -hemoptysis,  Gi: Denies swallowing difficulty, stomach pain, nausea or vomiting, diarrhea, constipation, bowel incontinence Gu:  Denies bladder incontinence, burning urine Ext:   Denies Joint pain, stiffness or swelling Skin: Denies  skin rash, easy bruising or bleeding or hives Endoc:  Denies polyuria, polydipsia , polyphagia or weight change Psych:   Denies depression, insomnia or hallucinations  Other:  All other systems negative  VITAL SIGNS: BP (!) 140/82 Comment: was talking and knows this can raise her BP -- has taken her BP meds today.  Pulse (!) 109   Temp 98.8 F (37.1 C)   Ht 5' 3 (1.6 m)   Wt (!) 311 lb 12.8 oz (141.4 kg)   SpO2 94%   BMI 55.23 kg/m    Physical  Examination:   General Appearance: No distress  EYES PERRLA, EOM intact.   NECK Supple, No JVD Pulmonary: normal breath sounds, No wheezing.  CardiovascularNormal S1,S2.  No m/r/g.   Abdomen: Benign, Soft, non-tender. Skin:   warm, no rashes, no ecchymosis  Extremities: normal, no cyanosis, clubbing. Neuro:without focal findings,  speech normal  PSYCHIATRIC: Mood, affect within normal limits.   ASSESSMENT AND PLAN  OSA Patient is using and benefiting from CPAP therapy. Discussed the consequences of untreated sleep apnea. Advised not to drive drowsy  for safety of patient and others. Will follow up in 6 months.   HTN Stable, on current management. Following with PCP.    Patient  satisfied with Plan of action and management. All questions answered  I spent a total of 35 minutes reviewing chart data, face-to-face evaluation with the patient, counseling and coordination of care as detailed above.    Lollie Gunner, M.D.  Sleep Medicine [facility]           [1]  Allergies Allergen Reactions   Chlorhexidine  Itching, Rash and Other (See Comments)    blisters   Actos [Pioglitazone]     Fluid retention    Adhesive [Tape]     Peels skin off-best to use paper tape    Ibuprofen Diarrhea and Nausea And Vomiting   Januvia [Sitagliptin]     Headache    Lisinopril     Dizziness    Statins Diarrhea and Other (See Comments)    Muscle cramps   Vicodin [Hydrocodone-Acetaminophen ] Nausea And Vomiting   Byetta 10 Mcg Pen [Exenatide] Rash   Olive Oil Rash    Olives & Olive oil   Peanut Oil Rash    Peanuts & peanut oil   "

## 2024-07-26 NOTE — Patient Instructions (Addendum)

## 2024-07-27 ENCOUNTER — Other Ambulatory Visit: Payer: Self-pay | Admitting: Nurse Practitioner

## 2024-07-27 DIAGNOSIS — Z1231 Encounter for screening mammogram for malignant neoplasm of breast: Secondary | ICD-10-CM

## 2024-08-05 NOTE — Addendum Note (Signed)
 Addended by: Lakeita Panther on: 08/05/2024 12:52 PM   Modules accepted: Level of Service

## 2024-08-10 ENCOUNTER — Ambulatory Visit: Admitting: Physician Assistant

## 2024-08-12 ENCOUNTER — Ambulatory Visit: Admitting: Physician Assistant

## 2024-08-18 NOTE — Progress Notes (Unsigned)
" °  Cardiology Office Note   Date: 08/18/2024  ID:  SAMANTHAJO Mathews 03/12/1961 [id number] PCP: Don Lauraine Collar, NP  [facility] Providers Cardiologist: Redell Cave, MD { Click to update primary MD,subspecialty MD or APP then REFRESH:1}    Chief Complaint: Stephanie Mathews is a 64 y.o.female with PMH of aortic atherosclerosis on CT 09/2023, whitecoat hypertension, hyperlipidemia, diastolic dysfunction on echo 01/2024, OSA on CPAP, insulin -dependent T2DM, obesity who presents to the clinic for five-month follow-up.    Stephanie Mathews established care with Dr. Cave 12/2023 after CT showed aortic atherosclerosis.  She did not complain of angina, but echocardiogram was recommended and showed LVEF 60-65%, G1DD.  She had previously been been intolerant to statins, she was started on ezetimibe .  She was also started on losartan  for renal protection.  Follow-up visit 02/2024 she was doing well.    History of Present Illness: Today ***  ROS: Denies chest pain, shortness of breath, orthopnea, PND, lower extremity edema, palpitations, lightheadedness, dizziness, syncope, abnormal bleeding. ***  Aortic atherosclerosis: Noted on CT scan 09/2023.  Referred to cardiology, echocardiogram reassuring.  She denies anginal symptoms. - Continue aspirin 81 mg daily - Continue ezetimibe  10 mg daily  Hyperlipidemia: 07/27/2024 LDL 121, HDL 64, TGs 236, total 233.  Previous intolerances to statins include *** - Continue ezetimibe  10 mg daily - Refer to pharmD and lipid clinic for consideration of PCSK9i  Hypertension: BP today ***, *** at home.  07/27/2024 SCR 1.2, K 4.4. - Continue losartan  12.5 mg daily  OSA: She reports wearing CPAP nightly. - Recommend continued compliance  Studies Reviewed: The following studies were reviewed today: ***      Risk Assessment/Calculations: {Does this patient have ATRIAL FIBRILLATION?:[phone]}  No BP recorded.  {Refresh Note OR Click here to  enter BP  :1}***            Physical Exam: VS: There were no vitals taken for this visit. Wt Readings from Last 3 Encounters:  07/26/24 (!) 311 lb 12.8 oz (141.4 kg)  04/21/24 (!) 307 lb 12.8 oz (139.6 kg)  03/09/24 (!) 308 lb 12.8 oz (140.1 kg)     GEN: *** Well nourished, in NAD HEENT: Normal NECK: No JVD, no carotid bruits LYMPHATICS: No lymphadenopathy CARDIAC: ***RRR, no murmurs, rubs, gallops RESPIRATORY: Clear to auscultation without rales, wheezing or rhonchi  ABDOMEN: Soft, non-tender, non-distended MUSCULOSKELETAL: No edema, no deformity  SKIN: Warm and dry NEUROLOGIC:  Alert and oriented x 3 PSYCHIATRIC:  Normal affect   Assessment & Plan: ***     {Are you ordering a CV Procedure (e.g. stress test, cath, DCCV, TEE, etc)?   Press F2        :[id number]}   Dispo: ***  Signed, Saddie GORMAN Cleaves, NP 08/18/2024 7:59 PM [facility] "

## 2024-08-19 ENCOUNTER — Ambulatory Visit: Admitting: Physician Assistant

## 2024-08-25 ENCOUNTER — Ambulatory Visit

## 2024-09-13 ENCOUNTER — Ambulatory Visit: Admitting: Physician Assistant

## 2024-11-01 ENCOUNTER — Ambulatory Visit: Admitting: Pulmonary Disease
